# Patient Record
Sex: Female | Born: 1965 | Race: White | Hispanic: No | Marital: Married | Smoking: Never smoker
Health system: Southern US, Community
[De-identification: ages and names within clinical notes are randomized; demographics above are authoritative.]

## PROBLEM LIST (undated history)

## (undated) DIAGNOSIS — Z8719 Personal history of other diseases of the digestive system: Secondary | ICD-10-CM

## (undated) DIAGNOSIS — Z8742 Personal history of other diseases of the female genital tract: Secondary | ICD-10-CM

## (undated) DIAGNOSIS — Z87898 Personal history of other specified conditions: Secondary | ICD-10-CM

## (undated) DIAGNOSIS — K589 Irritable bowel syndrome without diarrhea: Secondary | ICD-10-CM

## (undated) HISTORY — DX: Personal history of other specified conditions: Z87.898

## (undated) HISTORY — PX: REDUCTION MAMMAPLASTY: SUR839

## (undated) HISTORY — DX: Irritable bowel syndrome without diarrhea: K58.9

## (undated) HISTORY — DX: Irritable bowel syndrome, unspecified: K58.9

## (undated) HISTORY — DX: Personal history of other diseases of the digestive system: Z87.19

## (undated) HISTORY — DX: Personal history of other diseases of the female genital tract: Z87.42

---

## 1997-09-29 ENCOUNTER — Other Ambulatory Visit: Admission: RE | Admit: 1997-09-29 | Discharge: 1997-09-29 | Payer: Self-pay | Admitting: Obstetrics & Gynecology

## 1997-12-10 ENCOUNTER — Other Ambulatory Visit: Admission: RE | Admit: 1997-12-10 | Discharge: 1997-12-10 | Payer: Self-pay | Admitting: Obstetrics & Gynecology

## 1998-05-29 HISTORY — PX: OTHER SURGICAL HISTORY: SHX169

## 1998-05-31 ENCOUNTER — Ambulatory Visit (HOSPITAL_COMMUNITY): Admission: RE | Admit: 1998-05-31 | Discharge: 1998-05-31 | Payer: Self-pay | Admitting: *Deleted

## 1998-07-26 ENCOUNTER — Inpatient Hospital Stay (HOSPITAL_COMMUNITY): Admission: AD | Admit: 1998-07-26 | Discharge: 1998-07-26 | Payer: Self-pay | Admitting: Obstetrics & Gynecology

## 1998-08-12 ENCOUNTER — Inpatient Hospital Stay (HOSPITAL_COMMUNITY): Admission: AD | Admit: 1998-08-12 | Discharge: 1998-08-15 | Payer: Self-pay | Admitting: Obstetrics and Gynecology

## 1998-08-18 ENCOUNTER — Encounter: Admission: RE | Admit: 1998-08-18 | Discharge: 1998-09-21 | Payer: Self-pay | Admitting: Obstetrics and Gynecology

## 1998-08-19 ENCOUNTER — Inpatient Hospital Stay (HOSPITAL_COMMUNITY): Admission: AD | Admit: 1998-08-19 | Discharge: 1998-08-19 | Payer: Self-pay | Admitting: Anesthesiology

## 1998-08-20 ENCOUNTER — Ambulatory Visit (HOSPITAL_COMMUNITY): Admission: RE | Admit: 1998-08-20 | Discharge: 1998-08-20 | Payer: Self-pay | Admitting: Anesthesiology

## 1998-08-20 ENCOUNTER — Encounter: Payer: Self-pay | Admitting: Anesthesiology

## 1998-09-08 ENCOUNTER — Encounter (HOSPITAL_COMMUNITY): Admission: RE | Admit: 1998-09-08 | Discharge: 1998-12-07 | Payer: Self-pay | Admitting: *Deleted

## 1998-09-29 ENCOUNTER — Emergency Department (HOSPITAL_COMMUNITY): Admission: EM | Admit: 1998-09-29 | Discharge: 1998-09-29 | Payer: Self-pay | Admitting: *Deleted

## 1998-09-30 ENCOUNTER — Inpatient Hospital Stay (HOSPITAL_COMMUNITY): Admission: EM | Admit: 1998-09-30 | Discharge: 1998-10-03 | Payer: Self-pay | Admitting: Emergency Medicine

## 1998-09-30 ENCOUNTER — Encounter: Payer: Self-pay | Admitting: Neurosurgery

## 1998-10-01 ENCOUNTER — Encounter: Payer: Self-pay | Admitting: Neurosurgery

## 1999-02-18 ENCOUNTER — Other Ambulatory Visit: Admission: RE | Admit: 1999-02-18 | Discharge: 1999-02-18 | Payer: Self-pay | Admitting: Obstetrics & Gynecology

## 2000-01-05 ENCOUNTER — Other Ambulatory Visit: Admission: RE | Admit: 2000-01-05 | Discharge: 2000-01-05 | Payer: Self-pay | Admitting: Obstetrics and Gynecology

## 2000-03-05 ENCOUNTER — Encounter: Payer: Self-pay | Admitting: Obstetrics & Gynecology

## 2000-03-05 ENCOUNTER — Ambulatory Visit (HOSPITAL_COMMUNITY): Admission: RE | Admit: 2000-03-05 | Discharge: 2000-03-05 | Payer: Self-pay | Admitting: Obstetrics & Gynecology

## 2000-07-24 ENCOUNTER — Inpatient Hospital Stay (HOSPITAL_COMMUNITY): Admission: AD | Admit: 2000-07-24 | Discharge: 2000-07-26 | Payer: Self-pay | Admitting: Obstetrics and Gynecology

## 2000-12-11 ENCOUNTER — Other Ambulatory Visit: Admission: RE | Admit: 2000-12-11 | Discharge: 2000-12-11 | Payer: Self-pay | Admitting: Obstetrics and Gynecology

## 2001-09-03 ENCOUNTER — Ambulatory Visit (HOSPITAL_COMMUNITY): Admission: RE | Admit: 2001-09-03 | Discharge: 2001-09-03 | Payer: Self-pay | Admitting: Obstetrics and Gynecology

## 2001-09-03 ENCOUNTER — Encounter: Payer: Self-pay | Admitting: Obstetrics and Gynecology

## 2002-02-04 ENCOUNTER — Inpatient Hospital Stay (HOSPITAL_COMMUNITY): Admission: AD | Admit: 2002-02-04 | Discharge: 2002-02-06 | Payer: Self-pay | Admitting: Obstetrics and Gynecology

## 2002-03-19 ENCOUNTER — Other Ambulatory Visit: Admission: RE | Admit: 2002-03-19 | Discharge: 2002-03-19 | Payer: Self-pay | Admitting: Obstetrics and Gynecology

## 2003-02-27 ENCOUNTER — Encounter: Payer: Self-pay | Admitting: Family Medicine

## 2003-02-27 LAB — CONVERTED CEMR LAB

## 2003-05-25 ENCOUNTER — Other Ambulatory Visit: Admission: RE | Admit: 2003-05-25 | Discharge: 2003-05-25 | Payer: Self-pay | Admitting: Obstetrics and Gynecology

## 2004-04-14 ENCOUNTER — Encounter: Admission: RE | Admit: 2004-04-14 | Discharge: 2004-04-14 | Payer: Self-pay | Admitting: Family Medicine

## 2004-07-12 ENCOUNTER — Other Ambulatory Visit: Admission: RE | Admit: 2004-07-12 | Discharge: 2004-07-12 | Payer: Self-pay | Admitting: Obstetrics and Gynecology

## 2005-06-05 ENCOUNTER — Encounter: Admission: RE | Admit: 2005-06-05 | Discharge: 2005-06-05 | Payer: Self-pay | Admitting: Family Medicine

## 2006-06-06 ENCOUNTER — Encounter: Admission: RE | Admit: 2006-06-06 | Discharge: 2006-06-06 | Payer: Self-pay | Admitting: Family Medicine

## 2006-12-24 ENCOUNTER — Encounter: Payer: Self-pay | Admitting: Family Medicine

## 2006-12-24 DIAGNOSIS — E7800 Pure hypercholesterolemia, unspecified: Secondary | ICD-10-CM | POA: Insufficient documentation

## 2006-12-27 ENCOUNTER — Ambulatory Visit: Payer: Self-pay | Admitting: Family Medicine

## 2006-12-27 LAB — CONVERTED CEMR LAB
Albumin: 4.2 g/dL (ref 3.5–5.2)
Basophils Relative: 0.2 % (ref 0.0–1.0)
Bilirubin, Direct: 0.1 mg/dL (ref 0.0–0.3)
Creatinine, Ser: 1 mg/dL (ref 0.4–1.2)
Eosinophils Relative: 1.3 % (ref 0.0–5.0)
GFR calc non Af Amer: 65 mL/min
Glucose, Bld: 90 mg/dL (ref 70–99)
HCT: 41.9 % (ref 36.0–46.0)
Hemoglobin: 14.4 g/dL (ref 12.0–15.0)
MCHC: 34.4 g/dL (ref 30.0–36.0)
Monocytes Absolute: 0.4 10*3/uL (ref 0.2–0.7)
Neutrophils Relative %: 44.1 % (ref 43.0–77.0)
Potassium: 4.2 meq/L (ref 3.5–5.1)
RBC: 4.53 M/uL (ref 3.87–5.11)
RDW: 11.9 % (ref 11.5–14.6)
Sodium: 140 meq/L (ref 135–145)
TSH: 1.65 microintl units/mL (ref 0.35–5.50)
Total Bilirubin: 0.9 mg/dL (ref 0.3–1.2)
WBC: 3.4 10*3/uL — ABNORMAL LOW (ref 4.5–10.5)

## 2007-01-02 ENCOUNTER — Ambulatory Visit: Payer: Self-pay | Admitting: Family Medicine

## 2007-01-02 DIAGNOSIS — M25559 Pain in unspecified hip: Secondary | ICD-10-CM | POA: Insufficient documentation

## 2007-01-02 DIAGNOSIS — M25519 Pain in unspecified shoulder: Secondary | ICD-10-CM | POA: Insufficient documentation

## 2007-09-30 ENCOUNTER — Encounter: Admission: RE | Admit: 2007-09-30 | Discharge: 2007-09-30 | Payer: Self-pay | Admitting: Obstetrics and Gynecology

## 2007-10-03 ENCOUNTER — Encounter: Admission: RE | Admit: 2007-10-03 | Discharge: 2007-10-03 | Payer: Self-pay | Admitting: Obstetrics and Gynecology

## 2008-04-09 ENCOUNTER — Encounter: Admission: RE | Admit: 2008-04-09 | Discharge: 2008-04-09 | Payer: Self-pay | Admitting: Family Medicine

## 2009-01-13 ENCOUNTER — Encounter: Admission: RE | Admit: 2009-01-13 | Discharge: 2009-01-13 | Payer: Self-pay | Admitting: Internal Medicine

## 2009-04-29 DIAGNOSIS — Z87898 Personal history of other specified conditions: Secondary | ICD-10-CM

## 2009-04-29 HISTORY — DX: Personal history of other specified conditions: Z87.898

## 2009-06-24 ENCOUNTER — Encounter: Admission: RE | Admit: 2009-06-24 | Discharge: 2009-06-24 | Payer: Self-pay | Admitting: Internal Medicine

## 2009-07-28 ENCOUNTER — Encounter (INDEPENDENT_AMBULATORY_CARE_PROVIDER_SITE_OTHER): Payer: Self-pay | Admitting: Obstetrics and Gynecology

## 2009-07-28 ENCOUNTER — Ambulatory Visit (HOSPITAL_COMMUNITY): Admission: RE | Admit: 2009-07-28 | Discharge: 2009-07-29 | Payer: Self-pay | Admitting: Obstetrics and Gynecology

## 2009-07-28 HISTORY — PX: DILATION AND CURETTAGE OF UTERUS: SHX78

## 2009-07-28 HISTORY — PX: ENDOMETRIAL ABLATION W/ NOVASURE: SUR434

## 2009-07-28 HISTORY — PX: CYSTOSCOPY: SUR368

## 2009-07-28 HISTORY — PX: HYSTEROSCOPY: SHX211

## 2009-12-30 ENCOUNTER — Encounter (INDEPENDENT_AMBULATORY_CARE_PROVIDER_SITE_OTHER): Payer: Self-pay | Admitting: *Deleted

## 2010-05-31 ENCOUNTER — Encounter
Admission: RE | Admit: 2010-05-31 | Discharge: 2010-05-31 | Payer: Self-pay | Source: Home / Self Care | Attending: Obstetrics and Gynecology | Admitting: Obstetrics and Gynecology

## 2010-06-19 ENCOUNTER — Encounter: Payer: Self-pay | Admitting: Obstetrics and Gynecology

## 2010-06-30 NOTE — Letter (Signed)
Summary: Nadara Eaton letter  [place] at [facility]  [address] [place], [place] [address]   Phone: [phone]  Fax: [phone]       12/30/2009 MRN: [id number]  HA SHANNAHAN [address] [place], [place]  [address]  Dear Ms. [facility] [place] and [place] announce the retirement of Arta Silence, M.D., from full-time practice at the [facility] office effective November 25, 2009 and his plans of returning part-time.  It is important to Dr. Hetty Ely and to our practice that you understand that [facility] has seven physicians in our office for your health care needs.  We will continue to offer the same exceptional care that you have today.    Dr. Hetty Ely has spoken to many of you about his plans for retirement and returning part-time in the fall.   We will continue to work with you through the transition to schedule appointments for you in the office and meet the high standards that [facility] is committed to.   Again, it is with great pleasure that we share the news that Dr. Hetty Ely will return to [facility] at [facility] in October of 2011 with a reduced schedule.    If you have any questions, or would like to request an appointment with one of our physicians, please call us at [phone] and press the option for Scheduling an appointment.  We take pleasure in providing you with excellent patient care and look forward to seeing you at your next office visit.  Our [facility] Physicians are:  Tillman Abide, M.D. Laurita Quint, M.D. Roxy Manns, M.D. Kerby Nora, M.D. Hannah Beat, M.D. Ruthe Mannan, M.D. We proudly welcomed Raechel Ache, M.D. and Eustaquio Boyden, M.D. to the practice in July/August 2011.  Sincerely,  [place]

## 2010-08-19 LAB — CBC
HCT: 37.1 % (ref 36.0–46.0)
Hemoglobin: 15 g/dL (ref 12.0–15.0)
MCHC: 33.2 g/dL (ref 30.0–36.0)
Platelets: 194 10*3/uL (ref 150–400)
Platelets: 209 10*3/uL (ref 150–400)
RBC: 3.94 MIL/uL (ref 3.87–5.11)
RDW: 13 % (ref 11.5–15.5)
WBC: 14.8 10*3/uL — ABNORMAL HIGH (ref 4.0–10.5)

## 2010-10-14 NOTE — H&P (Signed)
NAME:  CATELIN, MANTHE                      ACCOUNT NO.:  [id number]   MEDICAL RECORD NO.:  [id number]                   PATIENT TYPE:  MAT   LOCATION:  [facility]                                 FACILITY:  [facility]   PHYSICIAN:  Hal Morales, M.D.             DATE OF BIRTH:  09/25/1965   DATE OF ADMISSION:  02/04/2002  DATE OF DISCHARGE:                                HISTORY & PHYSICAL   HISTORY OF PRESENT ILLNESS:  The patient is a 45 year old, married, white  female, gravida 4, para 2-0-1-2, at [redacted] weeks gestation today, who presents  complaining of uterine contractions that were strong and regular last night,  but now less regular and less strong, but still having bloody show and lots  of pressure.  She reports positive fetal movement.  She denies headache or  visual disturbances.  She also denies any nausea or vomiting.  Her pregnancy  has been followed at [facility] by the certified nurse midwife  service and has been essentially uncomplicated, though at risk for:  1.  History of PIH with her first pregnancy.  2.  History of advanced maternal  age with no amniocentesis with this pregnancy.  3. History of rapid labor.  4.  History of back surgery.  Her group B Streptococcus  is negative.   OBSTETRICAL/GYNECOLOGICAL HISTORY:  She is a gravida 4, para 2-0-1-2, who  had a miscarriage in 1988 with no complications.  In March of 2000, she  delivered a viable female infant who weighed 6 pounds 7 ounces at [redacted] weeks  gestation following a 12-hour labor.  She did have an epidural with that  labor and was induced for post dates.  She had back problems following that  delivery and actually had surgery approximately seven weeks postpartum.  In  February of 2002, she delivered another viable female infant who weighed 7  pounds 4 ounces at 39-5/7 weeks following a three-hour labor also with no  complications, attended by Concha Pyo. Duplantis, C.N.M.   ALLERGIES:  She has no known drug  allergies.   PAST MEDICAL HISTORY:  She reports having had the usual childhood diseases.  History of shingles in June of 2000.  No other health problems.   PAST SURGICAL HISTORY:  Her only surgical history was repair of herniated  disk in May of 2000 and wisdom tooth removal at age 25.   FAMILY HISTORY:  Significant for mother and a sister with hypertension,  maternal grandmother with heart disease, maternal grandfather with stroke.  Mother with type 3 diabetes.  Maternal grandmother with unknown kind of  cancer.   GENETIC HISTORY:  Negative, except that she is over age 27 and declined an  amniocentesis.   SOCIAL HISTORY:  She is married to [employer], who is involved and  supportive.  She is full-time employed as an Acupuncturist.  He is  employed full-time in [employer].  They are of  the [place] faith.  They deny  any illicit drug use, alcohol, or smoking with this pregnancy.   PRENATAL LABORATORY DATA:  Her blood type is O+.  Her antibody screen is  negative.  Syphilis is nonreactive.  Rubella is immune.  Hepatitis B surface  antigen is negative.  HIV was declined.  GC and chlamydia were declined.  Her Pap was within normal limits.  Her one-hour Glucola was within normal  range.  Her maternal serum alpha fetoprotein was also within normal range.  Her 36-week beta Staphylococcus was negative.   PHYSICAL EXAMINATION:  VITAL SIGNS:  Stable.  She is afebrile.  HEENT:  Grossly within normal limits.  HEART:  Regular rate and rhythm.  CHEST:  Clear.  BREASTS:  Soft and nontender.  ABDOMEN:  Gravid with irregular mild uterine contractions.  The fetal heart  rate is reactive and reassuring.  PELVIC:  Her cervix is approximately 4 cm, 80%, and vertex -1 to 0.  Bulging  membranes.  Positive bloody show.  EXTREMITIES:  Within normal limits.   ASSESSMENT:  1. Intrauterine pregnancy at term.  2. Early labor with advanced cervical changes.  3. Negative group B  Streptococcus.  4. History of rapid labor.   PLAN:  Admit to labor and delivery per consultation with Hal Morales,  M.D.  We will plan to follow routine C.N.M. orders to artificially rupture  of membranes and the patient is in agreement with the plan.     Concha Pyo. Duplantis, C.N.M.              Hal Morales, M.D.    SJD/MEDQ  D:  02/04/2002  T:  02/04/2002  Job:  [id number]
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
## 2011-05-30 HISTORY — PX: APPENDECTOMY: SHX54

## 2011-06-06 ENCOUNTER — Other Ambulatory Visit: Payer: Self-pay | Admitting: Obstetrics and Gynecology

## 2011-06-06 DIAGNOSIS — Z1231 Encounter for screening mammogram for malignant neoplasm of breast: Secondary | ICD-10-CM

## 2011-06-23 ENCOUNTER — Ambulatory Visit
Admission: RE | Admit: 2011-06-23 | Discharge: 2011-06-23 | Disposition: A | Discharge status: Home / Self Care | Payer: BC Managed Care – PPO | Source: Ambulatory Visit | Attending: Obstetrics and Gynecology | Admitting: Obstetrics and Gynecology

## 2011-06-23 DIAGNOSIS — Z1231 Encounter for screening mammogram for malignant neoplasm of breast: Secondary | ICD-10-CM

## 2011-06-28 ENCOUNTER — Other Ambulatory Visit: Payer: Self-pay | Admitting: Obstetrics and Gynecology

## 2011-06-28 DIAGNOSIS — R928 Other abnormal and inconclusive findings on diagnostic imaging of breast: Secondary | ICD-10-CM

## 2011-07-14 ENCOUNTER — Ambulatory Visit
Admission: RE | Admit: 2011-07-14 | Discharge: 2011-07-14 | Disposition: A | Discharge status: Home / Self Care | Payer: BC Managed Care – PPO | Source: Ambulatory Visit | Attending: Obstetrics and Gynecology | Admitting: Obstetrics and Gynecology

## 2011-07-14 DIAGNOSIS — R928 Other abnormal and inconclusive findings on diagnostic imaging of breast: Secondary | ICD-10-CM

## 2011-09-22 ENCOUNTER — Ambulatory Visit (INDEPENDENT_AMBULATORY_CARE_PROVIDER_SITE_OTHER): Payer: BC Managed Care – PPO | Admitting: Obstetrics and Gynecology

## 2011-09-22 ENCOUNTER — Encounter: Payer: Self-pay | Admitting: Obstetrics and Gynecology

## 2011-09-22 VITALS — BP 124/88 | Temp 98.3°F | Resp 16 | Ht 66.0 in | Wt 182.0 lb

## 2011-09-22 DIAGNOSIS — N63 Unspecified lump in unspecified breast: Secondary | ICD-10-CM

## 2011-09-22 NOTE — Progress Notes (Signed)
Reason for consultation: Right breast mass  History of present illness: This is a 46 year old female who presents with complaints of finding a left breast mass one month ago.  There is no pain or leaking.  There is no family history of breast cancer.  She is a waiting a breast reduction surgery in July with Dr. Odis Luster.  Breast exam: Left breast normal with negative nodes.  0.5 cm solid nodule behind right nipple, nontender and mobile.  Negative nodes.  Assessment: Right breast mass  Plan: Patient is referred for diagnostic mammogram at the breast center.

## 2011-09-29 ENCOUNTER — Ambulatory Visit
Admission: RE | Admit: 2011-09-29 | Discharge: 2011-09-29 | Disposition: A | Discharge status: Home / Self Care | Payer: BC Managed Care – PPO | Source: Ambulatory Visit | Attending: Obstetrics and Gynecology | Admitting: Obstetrics and Gynecology

## 2011-09-29 ENCOUNTER — Other Ambulatory Visit: Payer: Self-pay | Admitting: Obstetrics and Gynecology

## 2011-09-29 DIAGNOSIS — N63 Unspecified lump in unspecified breast: Secondary | ICD-10-CM

## 2011-11-10 ENCOUNTER — Encounter: Payer: Self-pay | Admitting: Family Medicine

## 2011-11-10 ENCOUNTER — Ambulatory Visit (INDEPENDENT_AMBULATORY_CARE_PROVIDER_SITE_OTHER): Payer: BC Managed Care – PPO | Admitting: Family Medicine

## 2011-11-10 VITALS — BP 160/102 | HR 80 | Temp 98.2°F | Wt 181.0 lb

## 2011-11-10 DIAGNOSIS — E785 Hyperlipidemia, unspecified: Secondary | ICD-10-CM

## 2011-11-10 DIAGNOSIS — I10 Essential (primary) hypertension: Secondary | ICD-10-CM | POA: Insufficient documentation

## 2011-11-10 DIAGNOSIS — R030 Elevated blood-pressure reading, without diagnosis of hypertension: Secondary | ICD-10-CM | POA: Insufficient documentation

## 2011-11-10 DIAGNOSIS — E7800 Pure hypercholesterolemia, unspecified: Secondary | ICD-10-CM

## 2011-11-10 MED ORDER — LISINOPRIL 20 MG PO TABS: 20.0000 mg | ORAL_TABLET | Freq: Every day | ORAL | Status: DC

## 2011-11-10 NOTE — Progress Notes (Signed)
  Subjective:    Patient ID: Abigail Stout, female    DOB: 1965/07/03, 46 y.o.   MRN: [id number]  HPI 46 yo pt new to me for acute visit for "nneds to check her BP."  Of note, she is no longer established here.  Has been seeing Tomi Bamberger, NP but she has closed her practice.  HTN- has been on and off antihypertensives for years. Under more stress lately, traveling. Not exercising or eating the way she should.  Has been having some HA.  Feels BP is up. No CP or SOB. Pos FH of HTN.  HLD- per pt does have elevated lipids. Not taking anything for it.  Patient Active Problem List  Diagnosis  . HYPERCHOLESTEROLEMIA  . SHOULDER PAIN, LEFT, CHRONIC  . HIP PAIN, LEFT  . Elevated blood pressure reading without diagnosis of hypertension  . HTN (hypertension)   Past Medical History  Diagnosis Date  . H/O urinary incontinence   . H/O menorrhagia   . Irritable bowel syndrome (IBS)   . H/O hemorrhoids   . History of urinary retention 04/29/09   Past Surgical History  Procedure Date  . Hysteroscopy 07/28/09  . Dilation and curettage of uterus 07/28/09  . Endometrial ablation w/ novasure 07/28/09  . Cystoscopy 07/28/09  . Lumbar diskectomy 2000   History  Substance Use Topics  . Smoking status: Never Smoker   . Smokeless tobacco: Not on file  . Alcohol Use: No   Family History  Problem Relation Age of Onset  . Diabetes Mother   . Hypertension Mother   . Stroke Paternal Grandfather    No Known Allergies Current Outpatient Prescriptions on File Prior to Visit  Medication Sig Dispense Refill  . lisinopril (PRINIVIL,ZESTRIL) 20 MG tablet Take 1 tablet (20 mg total) by mouth daily.  90 tablet  3   The PMH, PSH, Social History, Family History, Medications, and allergies have been reviewed in [facility], and have been updated if relevant.  Review of Systems See HPI    Objective:   Physical Exam BP 160/102  Pulse 80  Temp 98.2 F (36.8 C) (Oral)  Wt 181 lb (82.101 kg)  General:   Well-developed,well-nourished,in no acute distress; alert,appropriate and cooperative throughout examination Head:  normocephalic and atraumatic.   Eyes:  vision grossly intact, pupils equal, pupils round, and pupils reactive to light.   Ears:  R ear normal and L ear normal.   Nose:  no external deformity.   Mouth:  good dentition.   Lungs:  Normal respiratory effort, chest expands symmetrically. Lungs are clear to auscultation, no crackles or wheezes. Heart:  Normal rate and regular rhythm. S1 and S2 normal without gallop, murmur, click, rub or other extra sounds. Abdomen:  Bowel sounds positive,abdomen soft and non-tender without masses, organomegaly or hernias noted. Msk:  No deformity or scoliosis noted of thoracic or lumbar spine.   Extremities:  No clubbing, cyanosis, edema, or deformity noted with normal full range of motion of all joints.   Neurologic:  alert & oriented X3 and gait normal.   Skin:  Intact without suspicious lesions or rashes Psych:  Cognition and judgment appear intact. Alert and cooperative with normal attention span and concentration. No apparent delusions, illusions, hallucinations    Assessment & Plan:   1. HTN (hypertension)  Deteriorated. Start lisinopril 20 mg daily. Check CMET Follow up in 2 weeks. Comprehensive metabolic panel  2. HLD (hyperlipidemia)  Lipid Panel

## 2011-11-10 NOTE — Patient Instructions (Addendum)
We are starting lisinopril today. Please make appt to see me in 2 weeks.  Cut back on salt intake. Increase physical activity.

## 2011-11-11 LAB — COMPREHENSIVE METABOLIC PANEL
ALT: 13 U/L (ref 0–35)
Albumin: 3.9 g/dL (ref 3.5–5.2)
CO2: 28 mEq/L (ref 19–32)
Calcium: 9.4 mg/dL (ref 8.4–10.5)
Chloride: 107 mEq/L (ref 96–112)
Creat: 0.98 mg/dL (ref 0.50–1.10)
Potassium: 4.7 mEq/L (ref 3.5–5.3)
Total Protein: 6.4 g/dL (ref 6.0–8.3)

## 2011-11-11 LAB — LIPID PANEL
Cholesterol: 203 mg/dL — ABNORMAL HIGH (ref 0–200)
LDL Cholesterol: 122 mg/dL — ABNORMAL HIGH (ref 0–99)
Total CHOL/HDL Ratio: 3.3 Ratio
Triglycerides: 99 mg/dL (ref <150)
VLDL: 20 mg/dL (ref 0–40)

## 2011-11-17 ENCOUNTER — Encounter: Payer: Self-pay | Admitting: *Deleted

## 2011-11-28 ENCOUNTER — Other Ambulatory Visit: Payer: Self-pay | Admitting: Plastic Surgery

## 2011-11-29 HISTORY — PX: BREAST REDUCTION SURGERY: SHX8

## 2012-02-09 ENCOUNTER — Inpatient Hospital Stay: Payer: Self-pay | Admitting: Surgery

## 2012-02-09 LAB — URINALYSIS, COMPLETE
Bilirubin,UR: NEGATIVE
Leukocyte Esterase: NEGATIVE
Ph: 5 (ref 4.5–8.0)
Protein: NEGATIVE
RBC,UR: NONE SEEN /HPF (ref 0–5)
Squamous Epithelial: 1
WBC UR: <1 /HPF (ref 0–5)

## 2012-02-09 LAB — CBC
HCT: 44.2 % (ref 35.0–47.0)
HGB: 14.9 g/dL (ref 12.0–16.0)
MCH: 30.5 pg (ref 26.0–34.0)
MCHC: 33.7 g/dL (ref 32.0–36.0)

## 2012-02-09 LAB — COMPREHENSIVE METABOLIC PANEL
Albumin: 4 g/dL (ref 3.4–5.0)
Alkaline Phosphatase: 76 U/L (ref 50–136)
Bilirubin,Total: 0.4 mg/dL (ref 0.2–1.0)
Creatinine: 1.02 mg/dL (ref 0.60–1.30)
Glucose: 105 mg/dL — ABNORMAL HIGH (ref 65–99)
Osmolality: 281 (ref 275–301)
SGPT (ALT): 21 U/L (ref 12–78)
Sodium: 139 mmol/L (ref 136–145)

## 2012-02-09 LAB — PREGNANCY, URINE: Pregnancy Test, Urine: NEGATIVE m[IU]/mL

## 2012-02-13 LAB — PATHOLOGY REPORT

## 2012-04-23 ENCOUNTER — Encounter: Payer: BC Managed Care – PPO | Admitting: Sports Medicine

## 2012-05-09 ENCOUNTER — Ambulatory Visit (INDEPENDENT_AMBULATORY_CARE_PROVIDER_SITE_OTHER): Payer: BC Managed Care – PPO | Admitting: Sports Medicine

## 2012-05-09 VITALS — BP 148/81 | Ht 66.0 in | Wt 175.0 lb

## 2012-05-09 DIAGNOSIS — R269 Unspecified abnormalities of gait and mobility: Secondary | ICD-10-CM | POA: Insufficient documentation

## 2012-05-09 DIAGNOSIS — Q667 Congenital pes cavus, unspecified foot: Secondary | ICD-10-CM

## 2012-05-09 DIAGNOSIS — M216X9 Other acquired deformities of unspecified foot: Secondary | ICD-10-CM

## 2012-05-09 NOTE — Assessment & Plan Note (Signed)
When not wearing orthotics she should continue to shop for shoes that have excellent arch support  She already knows that she feels better with the support  She can also use the arch strap she is found as it seems to help as well

## 2012-05-09 NOTE — Progress Notes (Signed)
Abigail Stout is a 46 y.o. female who presents to [facility] today for foot pain. Present for years. She was diagnosed with high arch and ankle subluxation in the past.  She had orthotics years ago which helped however she no longer has any. Additionally she notes some knee and back pain when not wearing her arch supports.  She notes the over-the-counter arch supports are helpful but not fully, and is interested in orthotics today.  She feels well otherwise without any radiating pain weakness or numbness.   PMH reviewed. Hypertension History  Substance Use Topics  . Smoking status: Never Smoker   . Smokeless tobacco: Not on file  . Alcohol Use: No   ROS as above otherwise neg   Exam:  BP 148/81  Ht 5\' 6"  (1.676 m)  Wt 175 lb (79.379 kg)  BMI 28.25 kg/m2 Gen: Well NAD MSK: Feet bilaterally.  Significant cavus foot, which collapse more than 50% with standing.  Bunionette bilaterally with developing hallux rigidus bilaterally.  Preserved posterior tibialis function with toe standing.   Leg length are equal Hip abduction strength is 5/5 bilaterally  Gait: Significant ankle pronation with mild subluxation bilaterally with external rotation of the feet bilaterally.     Patient was fitted for a : standard, cushioned, semi-rigid orthotic. The orthotic was heated and afterward the patient stood on the orthotic blank positioned on the orthotic stand. The patient was positioned in subtalar neutral position and 10 degrees of ankle dorsiflexion in a weight bearing stance. After completion of molding, a stable base was applied to the orthotic blank. The blank was ground to a stable position for weight bearing. Size: 9 red EVA Base: Blue EVA Posting and Padding: none

## 2012-05-09 NOTE — Patient Instructions (Addendum)
Thank you for coming in today. Let's see how these orthotics do,  If they are not right please come back Please see your primary care Dr. about your blood pressure

## 2012-05-09 NOTE — Assessment & Plan Note (Addendum)
Associated with a cavus foot. She is developing some breakdown of long arch and transverse arch RT > LT Plan: Custom orthotics today Followup as needed

## 2012-06-25 ENCOUNTER — Other Ambulatory Visit: Payer: Self-pay | Admitting: Obstetrics and Gynecology

## 2012-06-25 DIAGNOSIS — Z1231 Encounter for screening mammogram for malignant neoplasm of breast: Secondary | ICD-10-CM

## 2012-07-03 ENCOUNTER — Ambulatory Visit
Admission: RE | Admit: 2012-07-03 | Discharge: 2012-07-03 | Disposition: A | Discharge status: Home / Self Care | Payer: BC Managed Care – PPO | Source: Ambulatory Visit | Attending: Obstetrics and Gynecology | Admitting: Obstetrics and Gynecology

## 2012-07-03 DIAGNOSIS — Z1231 Encounter for screening mammogram for malignant neoplasm of breast: Secondary | ICD-10-CM

## 2012-07-18 ENCOUNTER — Encounter: Payer: Self-pay | Admitting: Obstetrics and Gynecology

## 2012-07-18 ENCOUNTER — Ambulatory Visit: Payer: BC Managed Care – PPO | Admitting: Obstetrics and Gynecology

## 2012-07-18 VITALS — BP 104/70 | Ht 66.0 in | Wt 185.0 lb

## 2012-07-18 DIAGNOSIS — N3281 Overactive bladder: Secondary | ICD-10-CM

## 2012-07-18 DIAGNOSIS — N898 Other specified noninflammatory disorders of vagina: Secondary | ICD-10-CM

## 2012-07-18 DIAGNOSIS — R102 Pelvic and perineal pain: Secondary | ICD-10-CM

## 2012-07-18 DIAGNOSIS — IMO0002 Reserved for concepts with insufficient information to code with codable children: Secondary | ICD-10-CM

## 2012-07-18 LAB — POCT WET PREP (WET MOUNT): Clue Cells Wet Prep Whiff POC: NEGATIVE

## 2012-07-18 LAB — POCT URINALYSIS DIPSTICK
Ketones, UA: NEGATIVE
Protein, UA: NEGATIVE
Spec Grav, UA: 1.015
pH, UA: 6

## 2012-07-18 NOTE — Progress Notes (Signed)
Here secondary to pressure down there.  Still has urgency but s/p sling so denies SUI.    Filed Vitals:   07/18/12 1451  BP: 104/70   ROS: noncontributory  Pelvic exam:  VULVA: normal appearing vulva with no masses, tenderness or lesions,  VAGINA: normal appearing vagina with normal color and discharge, no lesions, grade 2 cystocele CERVIX: normal appearing cervix without discharge or lesions,  UTERUS: uterus is normal size, shape, consistency and nontender,  ADNEXA: normal adnexa in size, nontender and no masses. RECTAL no masses, grade 1 rectocele  A/P 6wks to f/u vesicare If sxs persist and bother pt enough, she may consider Ant poss post repair.  Will get u/s at that time as well. UCx AEX in 6wks too

## 2012-07-20 LAB — URINE CULTURE: Colony Count: NO GROWTH

## 2012-09-27 ENCOUNTER — Ambulatory Visit (INDEPENDENT_AMBULATORY_CARE_PROVIDER_SITE_OTHER): Payer: BC Managed Care – PPO | Admitting: Family Medicine

## 2012-09-27 ENCOUNTER — Encounter: Payer: Self-pay | Admitting: Family Medicine

## 2012-09-27 VITALS — BP 120/72 | HR 103 | Temp 98.2°F | Ht 66.0 in | Wt 186.5 lb

## 2012-09-27 DIAGNOSIS — J309 Allergic rhinitis, unspecified: Secondary | ICD-10-CM

## 2012-09-27 DIAGNOSIS — J209 Acute bronchitis, unspecified: Secondary | ICD-10-CM

## 2012-09-27 MED ORDER — GUAIFENESIN-CODEINE 100-10 MG/5ML PO SYRP: 5.0000 mL | ORAL_SOLUTION | Freq: Every evening | ORAL | Status: DC | PRN

## 2012-09-27 MED ORDER — AZITHROMYCIN 250 MG PO TABS: ORAL_TABLET | ORAL | Status: DC

## 2012-09-27 NOTE — Progress Notes (Signed)
  Subjective:    Patient ID: Abigail Stout, female    DOB: 30-Jan-1966, 47 y.o.   MRN: [id number]  Otalgia  There is pain in the left ear. The current episode started in the past 7 days (Noted symptoms start after blwing pollen /leaves outside with blower.). The problem occurs constantly. There has been no fever. Associated symptoms include coughing and a sore throat. Pertinent negatives include no headaches. Associated symptoms comments: No ear drainage..  Cough This is a new problem. The current episode started in the past 7 days. The problem has been gradually worsening. The problem occurs constantly. The cough is non-productive (keeping her up aty night). Associated symptoms include ear pain, nasal congestion and a sore throat. Pertinent negatives include no chills, fever, headaches, shortness of breath or wheezing. The symptoms are aggravated by pollens. Risk factors: non smoker. She has tried OTC cough suppressant (robitussin DM, guafenesin) for the symptoms. There is no history of asthma, bronchiectasis, bronchitis, COPD, emphysema, environmental allergies or pneumonia.      Review of Systems  Constitutional: Negative for fever and chills.  HENT: Positive for ear pain and sore throat.   Respiratory: Positive for cough. Negative for shortness of breath and wheezing.   Allergic/Immunologic: Negative for environmental allergies.  Neurological: Negative for headaches.       Objective:   Physical Exam  Constitutional: She is oriented to person, place, and time. Vital signs are normal. She appears well-developed and well-nourished. She is cooperative.  Non-toxic appearance. She does not appear ill. No distress.  HENT:  Head: Normocephalic.  Right Ear: Hearing, tympanic membrane, external ear and ear canal normal. Tympanic membrane is not erythematous, not retracted and not bulging.  Left Ear: Hearing, tympanic membrane, external ear and ear canal normal. Tympanic membrane is not  erythematous, not retracted and not bulging.  Nose: Mucosal edema and rhinorrhea present. Right sinus exhibits no maxillary sinus tenderness and no frontal sinus tenderness. Left sinus exhibits no maxillary sinus tenderness and no frontal sinus tenderness.  Mouth/Throat: Uvula is midline and mucous membranes are normal. Posterior oropharyngeal erythema present.  Eyes: Conjunctivae, EOM and lids are normal. Pupils are equal, round, and reactive to light. No foreign bodies found.  Neck: Trachea normal and normal range of motion. Neck supple. Carotid bruit is not present. No mass and no thyromegaly present.  Cardiovascular: Normal rate, regular rhythm, S1 normal, S2 normal, normal heart sounds, intact distal pulses and normal pulses.  Exam reveals no gallop and no friction rub.   No murmur heard. Pulmonary/Chest: Effort normal and breath sounds normal. Not tachypneic. No respiratory distress. She has no decreased breath sounds. She has no wheezes. She has no rhonchi. She has no rales.  Constant cough  Neurological: She is alert and oriented to person, place, and time.  Skin: Skin is warm, dry and intact. No rash noted.  Psychiatric: Her speech is normal and behavior is normal. Judgment normal. Her mood appears not anxious. Cognition and memory are normal. She does not exhibit a depressed mood.          Assessment & Plan:

## 2012-09-27 NOTE — Patient Instructions (Addendum)
Guaifenesin during the day, cough Supressant at night. Also zyrtec at bedtime for allergy component. Complete course of antibiotics. If cough is persistent .Marland Kitchen Call you may need albuterol inhaler for reactive airways cough.

## 2012-09-27 NOTE — Assessment & Plan Note (Signed)
Symptoms ongoing x 1 week... Cover with antibiotics.  Treat for possible allergic component. Cough suppressant at night.

## 2012-12-18 ENCOUNTER — Other Ambulatory Visit: Payer: Self-pay | Admitting: Family Medicine

## 2013-04-03 ENCOUNTER — Other Ambulatory Visit: Payer: Self-pay

## 2013-04-21 ENCOUNTER — Other Ambulatory Visit: Payer: Self-pay | Admitting: Family Medicine

## 2013-04-21 NOTE — Telephone Encounter (Signed)
Last office visit 09/27/2012.  Ok to refill?

## 2013-07-04 ENCOUNTER — Other Ambulatory Visit: Payer: Self-pay

## 2013-07-04 DIAGNOSIS — Z9889 Other specified postprocedural states: Secondary | ICD-10-CM

## 2013-07-04 DIAGNOSIS — Z1231 Encounter for screening mammogram for malignant neoplasm of breast: Secondary | ICD-10-CM

## 2013-07-21 ENCOUNTER — Ambulatory Visit
Admission: RE | Admit: 2013-07-21 | Discharge: 2013-07-21 | Disposition: A | Discharge status: Home / Self Care | Payer: BC Managed Care – PPO | Source: Ambulatory Visit

## 2013-07-21 DIAGNOSIS — Z9889 Other specified postprocedural states: Secondary | ICD-10-CM

## 2013-07-21 DIAGNOSIS — Z1231 Encounter for screening mammogram for malignant neoplasm of breast: Secondary | ICD-10-CM

## 2013-08-25 ENCOUNTER — Other Ambulatory Visit: Payer: Self-pay | Admitting: Family Medicine

## 2013-08-25 NOTE — Telephone Encounter (Signed)
Last office visit 09/27/2012 for Bronchitis.  Ok to refill?

## 2013-08-25 NOTE — Telephone Encounter (Signed)
Nees app for [employer], labs priort.. Refill until then.

## 2013-09-04 ENCOUNTER — Telehealth: Payer: Self-pay | Admitting: Family Medicine

## 2013-09-04 ENCOUNTER — Other Ambulatory Visit (INDEPENDENT_AMBULATORY_CARE_PROVIDER_SITE_OTHER): Payer: BC Managed Care – PPO

## 2013-09-04 DIAGNOSIS — E7800 Pure hypercholesterolemia, unspecified: Secondary | ICD-10-CM

## 2013-09-04 DIAGNOSIS — I10 Essential (primary) hypertension: Secondary | ICD-10-CM

## 2013-09-04 LAB — COMPREHENSIVE METABOLIC PANEL
ALK PHOS: 48 U/L (ref 39–117)
ALT: 16 U/L (ref 0–35)
AST: 18 U/L (ref 0–37)
Albumin: 3.7 g/dL (ref 3.5–5.2)
BILIRUBIN TOTAL: 0.6 mg/dL (ref 0.3–1.2)
BUN: 13 mg/dL (ref 6–23)
CO2: 29 mEq/L (ref 19–32)
CREATININE: 0.9 mg/dL (ref 0.4–1.2)
Calcium: 9.3 mg/dL (ref 8.4–10.5)
Chloride: 103 mEq/L (ref 96–112)
GFR: 68.1 mL/min (ref >60.00)
GLUCOSE: 83 mg/dL (ref 70–99)
Potassium: 4.2 mEq/L (ref 3.5–5.1)
SODIUM: 137 meq/L (ref 135–145)
TOTAL PROTEIN: 6.2 g/dL (ref 6.0–8.3)

## 2013-09-04 LAB — LIPID PANEL
CHOLESTEROL: 201 mg/dL — AB (ref 0–200)
HDL: 66.3 mg/dL (ref >39.00)
LDL CALC: 122 mg/dL — AB (ref 0–99)
Total CHOL/HDL Ratio: 3
Triglycerides: 62 mg/dL (ref 0.0–149.0)
VLDL: 12.4 mg/dL (ref 0.0–40.0)

## 2013-09-04 NOTE — Telephone Encounter (Signed)
Relevant patient education assigned to patient using Emmi. ° °

## 2013-09-04 NOTE — Telephone Encounter (Signed)
Message copied by Eliezer Lofts E on Thu Sep 04, 2013  1:02 PM ------      Message from: Ellamae Sia      Created: Thu Sep 04, 2013  9:33 AM      Regarding: Lab orders asap       Patient is scheduled for CPX labs, please order future labs, Thanks , Terri       ------

## 2013-09-09 ENCOUNTER — Encounter: Payer: Self-pay | Admitting: Family Medicine

## 2013-09-09 ENCOUNTER — Other Ambulatory Visit (HOSPITAL_COMMUNITY)
Admission: RE | Admit: 2013-09-09 | Discharge: 2013-09-09 | Disposition: A | Discharge status: Home / Self Care | Payer: BC Managed Care – PPO | Source: Ambulatory Visit | Attending: Family Medicine | Admitting: Family Medicine

## 2013-09-09 ENCOUNTER — Ambulatory Visit (INDEPENDENT_AMBULATORY_CARE_PROVIDER_SITE_OTHER): Payer: BC Managed Care – PPO | Admitting: Family Medicine

## 2013-09-09 VITALS — BP 126/92 | HR 78 | Temp 97.8°F | Ht 65.5 in | Wt 182.0 lb

## 2013-09-09 DIAGNOSIS — M545 Low back pain, unspecified: Secondary | ICD-10-CM | POA: Insufficient documentation

## 2013-09-09 DIAGNOSIS — F5104 Psychophysiologic insomnia: Secondary | ICD-10-CM | POA: Insufficient documentation

## 2013-09-09 DIAGNOSIS — Z124 Encounter for screening for malignant neoplasm of cervix: Secondary | ICD-10-CM

## 2013-09-09 DIAGNOSIS — G8929 Other chronic pain: Secondary | ICD-10-CM

## 2013-09-09 DIAGNOSIS — G4700 Insomnia, unspecified: Secondary | ICD-10-CM

## 2013-09-09 DIAGNOSIS — E7800 Pure hypercholesterolemia, unspecified: Secondary | ICD-10-CM

## 2013-09-09 DIAGNOSIS — Z1151 Encounter for screening for human papillomavirus (HPV): Secondary | ICD-10-CM | POA: Insufficient documentation

## 2013-09-09 DIAGNOSIS — Z0000 Encounter for general adult medical examination without abnormal findings: Secondary | ICD-10-CM

## 2013-09-09 DIAGNOSIS — I10 Essential (primary) hypertension: Secondary | ICD-10-CM

## 2013-09-09 DIAGNOSIS — R1013 Epigastric pain: Secondary | ICD-10-CM

## 2013-09-09 DIAGNOSIS — Z01419 Encounter for gynecological examination (general) (routine) without abnormal findings: Secondary | ICD-10-CM | POA: Insufficient documentation

## 2013-09-09 MED ORDER — LISINOPRIL 20 MG PO TABS: ORAL_TABLET | ORAL | Status: DC

## 2013-09-09 MED ORDER — ALPRAZOLAM 0.5 MG PO TABS: 0.5000 mg | ORAL_TABLET | Freq: Every evening | ORAL | Status: DC | PRN

## 2013-09-09 NOTE — Progress Notes (Signed)
Alprazolam 0.5 mg called in to [facility].

## 2013-09-09 NOTE — Patient Instructions (Addendum)
Work on [employer] and regular exercise. Start red yeast rice 1200 mg twice daily. Return for lab only visit in 6 months. Can try prilosec 20 mg to 40 mg daily x 2-4 weeks if epigastric pain not continuing to improve.

## 2013-09-09 NOTE — Progress Notes (Signed)
Subjective:    Patient ID: Abigail Stout, female    DOB: August 03, 1965, 48 y.o.   MRN: [id number]  HPI The patient is here for annual wellness exam and preventative care.    She is doing well overall.   She did have epigastric pain (burning, off and on) starting after recent trip to [place] 3 weeks ago.  No associated after meals.  No reflux. No diarrhea  She has used probiotic,antacid, ginger has improved some in last few weeks.  She has noted looser area on right side of rectum in last year. No constipation, no trouble getting out stool but if presses on right stool comes out faster.  Has hx of cystocele and pelvic sling 2011.  In past she has used methocarbamol prn. Uses only once every three months.  Has  Chronic low back pain off and on Hx of diskectomy in 2000.  Chronic insomnia: uses alprazolam prn using once every 3-4 months.  No daytime anxiety. No depression, NO SI.  Tylenol pM does not help without feeling groggy the next day.  She would like to GYN exam here this year.  Hypertension:  Moderate control on lisinopril 20 mg daily.  Using medication without problems or lightheadedness:  none Chest pain with exertion:none Edema:None Short of breath:none Average home BPs: 125/84 Other issues: 4 lb weight loss since last OV. Wt Readings from Last 3 Encounters:  09/09/13 182 lb (82.555 kg)  09/27/12 186 lb 8 oz (84.596 kg)  07/18/12 185 lb (83.915 kg)   She is working on [employer]. Exercise: off and on. Planning on 5 K.   Elevated Cholesterol: LDL at goal < 130 on no med. Lab Results  Component Value Date   CHOL 201* 09/04/2013   HDL 66.30 09/04/2013   LDLCALC 122* 09/04/2013   TRIG 62.0 09/04/2013   CHOLHDL 3 09/04/2013  Diet compliance:Good. Exercise: Moderate. Other complaints:    Review of Systems  Constitutional: Negative for fever and fatigue.  HENT: Negative for ear pain.   Eyes: Negative for pain.  Respiratory: Negative for chest tightness and  shortness of breath.   Cardiovascular: Negative for chest pain, palpitations and leg swelling.  Gastrointestinal: Negative for abdominal pain.  Genitourinary: Negative for dysuria.       Objective:   Physical Exam  Constitutional: Vital signs are normal. She appears well-developed and well-nourished. She is cooperative.  Non-toxic appearance. She does not appear ill. No distress.  HENT:  Head: Normocephalic.  Right Ear: Hearing, tympanic membrane, external ear and ear canal normal.  Left Ear: Hearing, tympanic membrane, external ear and ear canal normal.  Nose: Nose normal.  Eyes: Conjunctivae, EOM and lids are normal. Pupils are equal, round, and reactive to light. Lids are everted and swept, no foreign bodies found.  Neck: Trachea normal and normal range of motion. Neck supple. Carotid bruit is not present. No mass and no thyromegaly present.  Cardiovascular: Normal rate, regular rhythm, S1 normal, S2 normal, normal heart sounds and intact distal pulses.  Exam reveals no gallop.   No murmur heard. Pulmonary/Chest: Effort normal and breath sounds normal. No respiratory distress. She has no wheezes. She has no rhonchi. She has no rales.  Abdominal: Soft. Normal appearance and bowel sounds are normal. She exhibits no distension, no fluid wave, no abdominal bruit and no mass. There is no hepatosplenomegaly. There is no tenderness. There is no rebound, no guarding and no CVA tenderness. No hernia.  Genitourinary: Vagina normal and uterus normal. Rectal  exam shows no mass, no tenderness and anal tone normal. No breast swelling, tenderness, discharge or bleeding. Pelvic exam was performed with patient supine. There is no rash, tenderness or lesion on the right labia. There is no rash, tenderness or lesion on the left labia. Uterus is not enlarged and not tender. Cervix exhibits no motion tenderness, no discharge and no friability. Right adnexum displays no mass, no tenderness and no fullness. Left  adnexum displays no mass, no tenderness and no fullness.  Rectocele small present  Lymphadenopathy:    She has no cervical adenopathy.    She has no axillary adenopathy.  Neurological: She is alert. She has normal strength. No cranial nerve deficit or sensory deficit.  Skin: Skin is warm, dry and intact. No rash noted.  Psychiatric: Her speech is normal and behavior is normal. Judgment normal. Her mood appears not anxious. Cognition and memory are normal. She does not exhibit a depressed mood.    Nml rectal tone, no mass palpated      Assessment & Plan:  The patient's preventative maintenance and recommended screening tests for an annual wellness exam were reviewed in full today. Brought up to date unless services declined.  Counselled on the importance of diet, exercise, and its role in overall health and mortality. The patient's FH and SH was reviewed, including their home life, tobacco status, and drug and alcohol status.   Vaccines: Due for Tdap 2016 Colon: No early family  history.  PAP/DVE: Last pap 2013  Mammo: 06/2013

## 2013-09-09 NOTE — Progress Notes (Signed)
Pre visit review using our clinic review tool, if applicable. No additional management support is needed unless otherwise documented below in the visit note. 

## 2013-09-10 ENCOUNTER — Telehealth: Payer: Self-pay | Admitting: Family Medicine

## 2013-09-10 NOTE — Telephone Encounter (Signed)
Relevant patient education assigned to patient using Emmi. ° °

## 2013-09-11 ENCOUNTER — Encounter: Payer: Self-pay | Admitting: *Deleted

## 2013-09-17 DIAGNOSIS — R1013 Epigastric pain: Secondary | ICD-10-CM | POA: Insufficient documentation

## 2013-09-17 NOTE — Assessment & Plan Note (Signed)
Encouraged exercise, weight loss, healthy eating habits. Well controlled. Continue current medication.  

## 2013-09-17 NOTE — Assessment & Plan Note (Signed)
Start red yeast rice 1200 mg twice daily. Return for lab only visit in 6 months.

## 2013-09-17 NOTE — Assessment & Plan Note (Signed)
Trial of PPI. 

## 2014-05-08 ENCOUNTER — Other Ambulatory Visit: Payer: Self-pay

## 2014-05-08 MED ORDER — ALPRAZOLAM 0.5 MG PO TABS: 0.5000 mg | ORAL_TABLET | Freq: Every evening | ORAL | Status: DC | PRN

## 2014-05-08 NOTE — Telephone Encounter (Signed)
Alprazolam called to [facility].  Orders for Labs mailed to patient's home address.

## 2014-05-08 NOTE — Telephone Encounter (Signed)
Pt left v/m requesting written lab order for lab testing that is 6 month f/u from 09/09/13 office visit; pt wants to go to [facility]. Pt also request refill alprazolam to [facility]. Pt ins changes 05/2014.Please advise.

## 2014-05-08 NOTE — Telephone Encounter (Signed)
Dr. Diona Browner pt, will send to [facility]

## 2014-05-08 NOTE — Telephone Encounter (Signed)
Rx for labs in outbox

## 2014-06-15 ENCOUNTER — Other Ambulatory Visit (INDEPENDENT_AMBULATORY_CARE_PROVIDER_SITE_OTHER): Payer: BLUE CROSS/BLUE SHIELD

## 2014-06-15 DIAGNOSIS — E7800 Pure hypercholesterolemia, unspecified: Secondary | ICD-10-CM

## 2014-06-15 LAB — LIPID PANEL
Cholesterol: 184 mg/dL (ref 0–200)
HDL: 58.6 mg/dL (ref >39.00)
LDL CALC: 108 mg/dL — AB (ref 0–99)
NonHDL: 125.4
Total CHOL/HDL Ratio: 3
Triglycerides: 86 mg/dL (ref 0.0–149.0)
VLDL: 17.2 mg/dL (ref 0.0–40.0)

## 2014-06-15 LAB — COMPREHENSIVE METABOLIC PANEL
ALT: 11 U/L (ref 0–35)
AST: 15 U/L (ref 0–37)
Albumin: 3.9 g/dL (ref 3.5–5.2)
Alkaline Phosphatase: 44 U/L (ref 39–117)
BUN: 11 mg/dL (ref 6–23)
CALCIUM: 9.3 mg/dL (ref 8.4–10.5)
CO2: 27 mEq/L (ref 19–32)
Chloride: 106 mEq/L (ref 96–112)
Creatinine, Ser: 0.89 mg/dL (ref 0.40–1.20)
GFR: 71.42 mL/min (ref >60.00)
Glucose, Bld: 96 mg/dL (ref 70–99)
POTASSIUM: 4.2 meq/L (ref 3.5–5.1)
Sodium: 138 mEq/L (ref 135–145)
TOTAL PROTEIN: 6.5 g/dL (ref 6.0–8.3)
Total Bilirubin: 0.5 mg/dL (ref 0.2–1.2)

## 2014-06-16 ENCOUNTER — Encounter: Payer: Self-pay | Admitting: *Deleted

## 2014-09-10 ENCOUNTER — Other Ambulatory Visit: Payer: Self-pay

## 2014-09-10 DIAGNOSIS — Z1231 Encounter for screening mammogram for malignant neoplasm of breast: Secondary | ICD-10-CM

## 2014-09-15 NOTE — H&P (Signed)
    Subjective/Chief Complaint RLQ pain x 1 day    History of Present Illness [name] is a relatively healthy 49 yo F with a history of 1 day of RLQ pain.  She says that it began suddently yesterday approx 6 pm.  Associated with nausea/vomiting initially.  Has since subsided but still hurts when she moves.  No diarrhea/constipation. Has never had pain like this before.  + Chills initially (none currently).  No fevers, shortness of breath, cough, chest pain, shortness of breath.    Past History Uterine polyp removal Uterine ablation Breast reduction Liposuction Hypertension    Past Medical Health Hypertension   Past Med/Surgical Hx:  breast reduction:   uterine ablation:   ALLERGIES:  No Known Allergies:     Medications Lisinopril   Family and Social History:   Family History Diabetes Mellitus  Stroke    Social History negative tobacco, negative ETOH, negative Illicit drugs    Place of Living Home   Review of Systems:   Subjective/Chief Complaint RLQ pain    Fever/Chills Yes    Cough No    Sputum No    Abdominal Pain Yes    Diarrhea No    Constipation No    Nausea/Vomiting Yes    SOB/DOE No    Chest Pain No    Dysuria No   Physical Exam:   GEN well developed, no acute distress    HEENT pink conjunctivae    NECK No masses    RESP normal resp effort  clear BS  no use of accessory muscles    CARD regular rate  no murmur  no thrills    ABD positive tenderness  denies Flank Tenderness  no hernia  soft  normal BS  no Adominal Mass    SKIN normal to palpation    NEURO cranial nerves intact    PSYCH alert, A+O to time, place, person, good insight     Assessment/Admission Diagnosis 49 yo F with acute onset RLQ pain. Elevated WBC.  Findings consistent with acute appendicitis    Plan To OR for laparoscopic appendectomy   Electronic Signatures: Beckham Capistran, Glena Norfolk (MD)  (Signed 13-Sep-13 07:22)  Authored: CHIEF COMPLAINT and HISTORY, PAST  MEDICAL/SURGIAL HISTORY, ALLERGIES, OTHER MEDICATIONS, FAMILY AND SOCIAL HISTORY, REVIEW OF SYSTEMS, PHYSICAL EXAM, ASSESSMENT AND PLAN   Last Updated: 13-Sep-13 07:22 by Floyde Parkins (MD)

## 2014-09-15 NOTE — Op Note (Signed)
PATIENT NAME:  Abigail, Stout MR#:  [id number] DATE OF BIRTH:  1965/10/26  DATE OF PROCEDURE:  02/09/2012  PREOPERATIVE DIAGNOSIS: Acute appendicitis.   POSTOPERATIVE DIAGNOSIS:  Acute appendicitis.  PROCEDURE PERFORMED: Laparoscopic appendectomy.   ANESTHESIA: General.   ESTIMATED BLOOD LOSS:  10 mL.  SPECIMENS: Appendix.   INDICATION FOR SURGERY: [name] is a 49 year old female with acute onset right lower quadrant pain and nausea. She had a white cell count of 14,000 and a CT scan concerning for appendicitis. We therefore brought her to the operating room for laparoscopic appendectomy.   DETAILS OF PROCEDURE:  Informed consent was obtained. The patient was then brought to the operating room suite and laid supine on the operating room table. She was induced, endotracheal tube was placed, and general anesthesia was administered. Her abdomen was then prepped and draped in standard surgical fashion. A time-out was performed and we then correctly identified the patient's name, operative site, and procedure to be performed. A supraumbilical incision was made. It was deepened to the fascia. The fascia was grasped. The fascia was incised. A finger was used to enter the abdominal cavity. There were no adhesions. 0 Vicryl stay sutures were then placed through the fascia and a Hassan trocar was placed in the abdomen. The abdomen was insufflated. A 30-degree 10-mm scope was then placed through the [facility] trocar. The appendix was not visualized immediately. We then placed two 5-mm trocars in the left lower quadrant and suprapubic region. I then used graspers and found the appendix. It was acutely inflamed and very long with a long mesentery. I then used a [place] to make a hole in the mesoappendix at the base of the appendix. I then used a laparoscopic stapler to ligate the base of the appendix. I then used a combination of hook cautery and two red laparoscopic staple loads to take the mesoappendix.  There was a small area of bleeding following ligation in the mesoappendix. I used a laparoscopic clip applier to clip concerning sites on the mesoappendix to ensure hemostasis.  I then removed the appendix through the supraumbilical port using an endocatch bag. I then again made sure hemostasis was obtained and irrigated the abdomen. I then removed all ports under direct visualization. The supraumbilical fascia was then closed with a figure-of-eight 0 Vicryl suture. The skin was then closed at all port sites using interrupted 4-0 Monocryl deep dermal sutures. I then placed Steri-Strips, Telfa, and Tegaderm over the wounds. The patient was then awakened.  She was extubated. She was brought to the postanesthesia care unit in satisfactory condition. The needle, sponge, and instrument counts were correct at the end of the procedure. I was present and scrubbed for the entirety of the case.     ____________________________ Glena Norfolk. Ashleah Valtierra, MD cal:bjt D: 02/09/2012 13:09:58 ET T: 02/09/2012 13:30:02 ET JOB#: [id number]  cc: Harrell Gave A. Prakash Kimberling, MD, <Dictator> Floyde Parkins MD ELECTRONICALLY SIGNED 02/09/2012 16:45

## 2014-09-21 ENCOUNTER — Ambulatory Visit
Admission: RE | Admit: 2014-09-21 | Discharge: 2014-09-21 | Disposition: A | Discharge status: Home / Self Care | Payer: BLUE CROSS/BLUE SHIELD | Source: Ambulatory Visit

## 2014-09-21 DIAGNOSIS — Z1231 Encounter for screening mammogram for malignant neoplasm of breast: Secondary | ICD-10-CM

## 2014-10-05 ENCOUNTER — Telehealth: Payer: Self-pay | Admitting: Family Medicine

## 2014-10-05 NOTE — Telephone Encounter (Signed)
No need for repeat labs unless pt has a concern.

## 2014-10-05 NOTE — Telephone Encounter (Signed)
Pt scheduled cpe for July, she had las done in Jan.  Do you want labs done again before cpe?

## 2014-10-06 NOTE — Telephone Encounter (Signed)
Left message for Abigail Stout that no labs are needed prior to her physical unless there is something that she is concerned about.

## 2014-10-16 ENCOUNTER — Ambulatory Visit: Payer: Self-pay | Admitting: Family Medicine

## 2014-11-01 ENCOUNTER — Other Ambulatory Visit: Payer: Self-pay | Admitting: Family Medicine

## 2014-12-18 ENCOUNTER — Encounter: Payer: Self-pay | Admitting: Family Medicine

## 2014-12-18 ENCOUNTER — Encounter: Payer: Self-pay | Admitting: Gastroenterology

## 2014-12-18 ENCOUNTER — Ambulatory Visit (INDEPENDENT_AMBULATORY_CARE_PROVIDER_SITE_OTHER): Payer: BLUE CROSS/BLUE SHIELD | Admitting: Family Medicine

## 2014-12-18 ENCOUNTER — Telehealth: Payer: Self-pay | Admitting: *Deleted

## 2014-12-18 ENCOUNTER — Ambulatory Visit (INDEPENDENT_AMBULATORY_CARE_PROVIDER_SITE_OTHER)
Admission: RE | Admit: 2014-12-18 | Discharge: 2014-12-18 | Disposition: A | Discharge status: Home / Self Care | Payer: BLUE CROSS/BLUE SHIELD | Source: Ambulatory Visit | Attending: Family Medicine | Admitting: Family Medicine

## 2014-12-18 VITALS — BP 116/72 | HR 71 | Temp 98.6°F | Ht 65.5 in | Wt 180.0 lb

## 2014-12-18 DIAGNOSIS — Z23 Encounter for immunization: Secondary | ICD-10-CM

## 2014-12-18 DIAGNOSIS — E7800 Pure hypercholesterolemia, unspecified: Secondary | ICD-10-CM

## 2014-12-18 DIAGNOSIS — I10 Essential (primary) hypertension: Secondary | ICD-10-CM | POA: Diagnosis not present

## 2014-12-18 DIAGNOSIS — M25551 Pain in right hip: Secondary | ICD-10-CM

## 2014-12-18 DIAGNOSIS — Z1211 Encounter for screening for malignant neoplasm of colon: Secondary | ICD-10-CM

## 2014-12-18 DIAGNOSIS — Z0000 Encounter for general adult medical examination without abnormal findings: Secondary | ICD-10-CM

## 2014-12-18 MED ORDER — METAXALONE 800 MG PO TABS: 800.0000 mg | ORAL_TABLET | Freq: Three times a day (TID) | ORAL | Status: DC | PRN

## 2014-12-18 MED ORDER — DICLOFENAC SODIUM 75 MG PO TBEC: 75.0000 mg | DELAYED_RELEASE_TABLET | Freq: Two times a day (BID) | ORAL | Status: DC

## 2014-12-18 NOTE — Progress Notes (Signed)
The patient is here for annual wellness exam and preventative care.    Hypertension:   Well controlled on lisinopril. BP Readings from Last 3 Encounters:  12/18/14 116/72  09/09/13 126/92  09/27/12 120/72  Using medication without problems or lightheadedness:  None Chest pain with exertion:none Edema:none Short of breath:none Average home BPs: 118/70 Other issues: Wt Readings from Last 3 Encounters:  12/18/14 180 lb (81.647 kg)  09/09/13 182 lb (82.555 kg)  09/27/12 186 lb 8 oz (84.596 kg)   Body mass index is 29.49 kg/(m^2).   Elevated Cholesterol: LDL at goal < 130 on red yeast rice. Lab Results  Component Value Date   CHOL 184 06/15/2014   HDL 58.60 06/15/2014   LDLCALC 108* 06/15/2014   TRIG 86.0 06/15/2014   CHOLHDL 3 06/15/2014    She is working on [employer], healthy eating Exercise: off and on. Other complaints:   Right hip pain, chronically.  Pain increase with int/ext rotation.  Low back paibn Robaxin does not help. Ibuprofen 800 mg TID x 3 days.  Review of Systems  Constitutional: Negative for fever and fatigue.  HENT: Negative for ear pain.  Eyes: Negative for pain.  Respiratory: Negative for chest tightness and shortness of breath.  Cardiovascular: Negative for chest pain, palpitations and leg swelling.  Gastrointestinal: Negative for abdominal pain.  Genitourinary: Negative for dysuria.       Objective:   Physical Exam  Constitutional: Vital signs are normal. She appears well-developed and well-nourished. She is cooperative. Non-toxic appearance. She does not appear ill. No distress.  HENT:  Head: Normocephalic.  Right Ear: Hearing, tympanic membrane, external ear and ear canal normal.  Left Ear: Hearing, tympanic membrane, external ear and ear canal normal.  Nose: Nose normal.  Eyes: Conjunctivae, EOM and lids are normal. Pupils are equal, round, and reactive to light. Lids are everted and swept, no foreign bodies found.  Neck:  Trachea normal and normal range of motion. Neck supple. Carotid bruit is not present. No mass and no thyromegaly present.  Cardiovascular: Normal rate, regular rhythm, S1 normal, S2 normal, normal heart sounds and intact distal pulses. Exam reveals no gallop.  No murmur heard. Pulmonary/Chest: Effort normal and breath sounds normal. No respiratory distress. She has no wheezes. She has no rhonchi. She has no rales.  Abdominal: Soft. Normal appearance and bowel sounds are normal. She exhibits no distension, no fluid wave, no abdominal bruit and no mass. There is no hepatosplenomegaly. There is no tenderness. There is no rebound, no guarding and no CVA tenderness. No hernia.  Genitourinary: Vagina normal and uterus normal. Rectal exam shows no mass, no tenderness and anal tone normal. No breast swelling, tenderness, discharge or bleeding. Pelvic exam was performed with patient supine. There is no rash, tenderness or lesion on the right labia. There is no rash, tenderness or lesion on the left labia. Uterus is not enlarged and not tender. Cervix exhibits no motion tenderness, no discharge and no friability. Right adnexum displays no mass, no tenderness and no fullness. Left adnexum displays no mass, no tenderness and no fullness.  Rectocele small present  Lymphadenopathy:   She has no cervical adenopathy.   She has no axillary adenopathy.  Neurological: She is alert. She has normal strength. No cranial nerve deficit or sensory deficit.  Skin: Skin is warm, dry and intact. No rash noted.  Psychiatric: Her speech is normal and behavior is normal. Judgment normal. Her mood appears not anxious. Cognition and memory are normal. She  does not exhibit a depressed mood.  MSK: ttp over right lateral hip, pain with int and ext rotation, neg Faber's, neg SLR, no current ttp in low backM.  Nml rectal tone, no mass palpated      Assessment & Plan:  The patient's preventative maintenance and recommended  screening tests for an annual wellness exam were reviewed in full today. Brought up to date unless services declined.  Counselled on the importance of diet, exercise, and its role in overall health and mortality. The patient's FH and SH was reviewed, including their home life, tobacco status, and drug and alcohol status.   Vaccines: Given today Tdap. Colon: No early family history.  Referred to GI.  PAP/DVE: Last pap 2015 nml, repeat in 3 years, DVE yearly Mammo: 08/2014  STD screening: Refused.

## 2014-12-18 NOTE — Assessment & Plan Note (Signed)
Likely OA versus intermittent bursitis.  Will eval with X-ray.  Muscle relaxant as pt requests not clearly best treatment. Most likely will try trial of NSAIDs.

## 2014-12-18 NOTE — Patient Instructions (Addendum)
Stop at front desk to set up referral ro GI for colonoscopy.  Keep up working on healthy eating, regular exercise and weight loss.  We will call with X-ray resutls and recommendations.

## 2014-12-18 NOTE — Telephone Encounter (Signed)
Sent in rx, diclofenac and skelaxin.

## 2014-12-18 NOTE — Assessment & Plan Note (Signed)
At goal on red yeast rice. 

## 2014-12-18 NOTE — Telephone Encounter (Signed)
-----   Message from Jinny Sanders, MD sent at 12/18/2014 12:55 PM EDT ----- Notify pt no arthritis seen in hips bilaterally, no fracture.  I recommend course of diclofenac 75 mg twice daily 2 week for possible bursitis.  #30, 0RF  We can also try skelaxin during the day ( muscle relaxant less likely to make her sleepy) if she would like. Let me known if agreeable.

## 2014-12-18 NOTE — Telephone Encounter (Signed)
Abigail Stout notified as instructed by telephone.  She would like to try the Skelaxin but would like to only take it at night.  Will forward to Dr. Diona Browner to send in Rx for Skelaxin.  Diclofenac 75 mg has already be sent in to her pharmacy.

## 2014-12-18 NOTE — Assessment & Plan Note (Signed)
Well controlled. Continue current medication. Encouraged exercise, weight loss, healthy eating habits.  

## 2014-12-18 NOTE — Progress Notes (Signed)
Pre visit review using our clinic review tool, if applicable. No additional management support is needed unless otherwise documented below in the visit note. 

## 2015-01-15 ENCOUNTER — Encounter: Payer: Self-pay | Admitting: Family Medicine

## 2015-01-15 ENCOUNTER — Ambulatory Visit (INDEPENDENT_AMBULATORY_CARE_PROVIDER_SITE_OTHER): Payer: BLUE CROSS/BLUE SHIELD | Admitting: Family Medicine

## 2015-01-15 VITALS — BP 112/80 | HR 85 | Temp 97.9°F | Ht 65.5 in

## 2015-01-15 DIAGNOSIS — J309 Allergic rhinitis, unspecified: Secondary | ICD-10-CM

## 2015-01-15 MED ORDER — DICLOFENAC SODIUM 75 MG PO TBEC: 75.0000 mg | DELAYED_RELEASE_TABLET | Freq: Two times a day (BID) | ORAL | Status: DC | PRN

## 2015-01-15 NOTE — Progress Notes (Signed)
   Subjective:    Patient ID: Abigail Stout, female    DOB: 1965-09-21, 49 y.o.   MRN: [id number]  HPI   49 year old female with history of allergic rhinitis presents with  chest congestion, cough  for last 9 days  Non productive, but mucus rattling around.  Post nasal drip.  Some nasal congestion.  No sinus pain, pressure.  Rare ear pain bilaterally.  No fever, NO SOB, no wheeze.  No itchy eyes, occ sneeze,    Has tried zyrtec, guaifenesin.   Nonsmoker.  Seems to have  This same issue every year in august.   Review of Systems  Constitutional: Negative for fever and fatigue.  HENT: Negative for ear pain.   Eyes: Negative for pain.  Respiratory: Negative for chest tightness and shortness of breath.   Cardiovascular: Negative for chest pain, palpitations and leg swelling.  Gastrointestinal: Negative for abdominal pain.  Genitourinary: Negative for dysuria.       Objective:   Physical Exam  Constitutional: Vital signs are normal. She appears well-developed and well-nourished. She is cooperative.  Non-toxic appearance. She does not appear ill. No distress.  HENT:  Head: Normocephalic.  Right Ear: Hearing, tympanic membrane, external ear and ear canal normal. Tympanic membrane is not erythematous, not retracted and not bulging.  Left Ear: Hearing, tympanic membrane, external ear and ear canal normal. Tympanic membrane is not erythematous, not retracted and not bulging.  Nose: Mucosal edema and rhinorrhea present. Right sinus exhibits no maxillary sinus tenderness and no frontal sinus tenderness. Left sinus exhibits no maxillary sinus tenderness and no frontal sinus tenderness.  Mouth/Throat: Uvula is midline, oropharynx is clear and moist and mucous membranes are normal.  Eyes: Conjunctivae, EOM and lids are normal. Pupils are equal, round, and reactive to light. Lids are everted and swept, no foreign bodies found.  Neck: Trachea normal and normal range of motion. Neck  supple. Carotid bruit is not present. No thyroid mass and no thyromegaly present.  Cardiovascular: Normal rate, regular rhythm, S1 normal, S2 normal, normal heart sounds, intact distal pulses and normal pulses.  Exam reveals no gallop and no friction rub.   No murmur heard. Pulmonary/Chest: Effort normal and breath sounds normal. No tachypnea. No respiratory distress. She has no decreased breath sounds. She has no wheezes. She has no rhonchi. She has no rales.  Neurological: She is alert.  Skin: Skin is warm, dry and intact. No rash noted.  Psychiatric: Her speech is normal and behavior is normal. Judgment normal. Her mood appears not anxious. Cognition and memory are normal. She does not exhibit a depressed mood.          Assessment & Plan:

## 2015-01-15 NOTE — Progress Notes (Signed)
Pre visit review using our clinic review tool, if applicable. No additional management support is needed unless otherwise documented below in the visit note. 

## 2015-01-15 NOTE — Patient Instructions (Signed)
Can try vit C 500 mg daily  to prevent viral infections.  Can try allegra for allergies.  Can use flonase to decrease post nasal drip.

## 2015-01-22 NOTE — Assessment & Plan Note (Signed)
Change to allegra and add flonase.  If not improving can consider singulair.  Discussed starting allergy meds prior to season of allergies each year.

## 2015-01-28 ENCOUNTER — Other Ambulatory Visit: Payer: Self-pay | Admitting: Family Medicine

## 2015-02-11 ENCOUNTER — Ambulatory Visit (AMBULATORY_SURGERY_CENTER): Payer: Self-pay

## 2015-02-11 VITALS — Ht 66.0 in | Wt 188.0 lb

## 2015-02-11 DIAGNOSIS — Z1211 Encounter for screening for malignant neoplasm of colon: Secondary | ICD-10-CM

## 2015-02-11 MED ORDER — SUPREP BOWEL PREP KIT 17.5-3.13-1.6 GM/177ML PO SOLN: 1.0000 | Freq: Once | ORAL | Status: DC

## 2015-02-11 NOTE — Progress Notes (Signed)
No allergies to eggs or soy No past problems with anesthesia PONV No diet/weight loss meds No home oxygen  Has email  Emmi instructions given for colonoscopy

## 2015-02-23 ENCOUNTER — Telehealth: Payer: Self-pay | Admitting: Gastroenterology

## 2015-02-23 NOTE — Telephone Encounter (Signed)
Left a message for patient to return my call. 

## 2015-02-24 NOTE — Telephone Encounter (Signed)
Patient returned my call and I informed her we do have a free sample kit of Suprep here at the office if she can come by and pick it up. Pt states she will come by office tomorrow and pick up kit.

## 2015-02-24 NOTE — Telephone Encounter (Signed)
Left a message for patient to return my call. 

## 2015-02-26 ENCOUNTER — Ambulatory Visit (AMBULATORY_SURGERY_CENTER): Payer: BLUE CROSS/BLUE SHIELD | Admitting: Gastroenterology

## 2015-02-26 ENCOUNTER — Encounter: Payer: Self-pay | Admitting: Gastroenterology

## 2015-02-26 VITALS — BP 114/89 | HR 79 | Temp 98.4°F | Resp 13 | Ht 66.0 in | Wt 188.0 lb

## 2015-02-26 DIAGNOSIS — D123 Benign neoplasm of transverse colon: Secondary | ICD-10-CM | POA: Diagnosis not present

## 2015-02-26 DIAGNOSIS — Z1211 Encounter for screening for malignant neoplasm of colon: Secondary | ICD-10-CM | POA: Diagnosis not present

## 2015-02-26 DIAGNOSIS — D125 Benign neoplasm of sigmoid colon: Secondary | ICD-10-CM

## 2015-02-26 DIAGNOSIS — D122 Benign neoplasm of ascending colon: Secondary | ICD-10-CM | POA: Diagnosis not present

## 2015-02-26 MED ORDER — SODIUM CHLORIDE 0.9 % IV SOLN: 500.0000 mL | INTRAVENOUS | Status: DC

## 2015-02-26 NOTE — Patient Instructions (Signed)
YOU HAD AN ENDOSCOPIC PROCEDURE TODAY AT THE [facility]:   Refer to the procedure report that was given to you for any specific questions about what was found during the examination.  If the procedure report does not answer your questions, please call your gastroenterologist to clarify.  If you requested that your care partner not be given the details of your procedure findings, then the procedure report has been included in a sealed envelope for you to review at your convenience later.  YOU SHOULD EXPECT: Some feelings of bloating in the abdomen. Passage of more gas than usual.  Walking can help get rid of the air that was put into your GI tract during the procedure and reduce the bloating. If you had a lower endoscopy (such as a colonoscopy or flexible sigmoidoscopy) you may notice spotting of blood in your stool or on the toilet paper. If you underwent a bowel prep for your procedure, you may not have a normal bowel movement for a few days.  Please Note:  You might notice some irritation and congestion in your nose or some drainage.  This is from the oxygen used during your procedure.  There is no need for concern and it should clear up in a day or so.  SYMPTOMS TO REPORT IMMEDIATELY:   Following lower endoscopy (colonoscopy or flexible sigmoidoscopy):  Excessive amounts of blood in the stool  Significant tenderness or worsening of abdominal pains  Swelling of the abdomen that is new, acute  Fever of 100F or higher   For urgent or emergent issues, a gastroenterologist can be reached at any hour by calling [phone].   DIET: Your first meal following the procedure should be a small meal and then it is ok to progress to your normal diet. Heavy or fried foods are harder to digest and may make you feel nauseous or bloated.  Likewise, meals heavy in dairy and vegetables can increase bloating.  Drink plenty of fluids but you should avoid alcoholic beverages for 24 hours. Try to  increase the fiber in your diet.  ACTIVITY:  You should plan to take it easy for the rest of today and you should NOT DRIVE or use heavy machinery until tomorrow (because of the sedation medicines used during the test).    FOLLOW UP: Our staff will call the number listed on your records the next business day following your procedure to check on you and address any questions or concerns that you may have regarding the information given to you following your procedure. If we do not reach you, we will leave a message.  However, if you are feeling well and you are not experiencing any problems, there is no need to return our call.  We will assume that you have returned to your regular daily activities without incident.  If any biopsies were taken you will be contacted by phone or by letter within the next 1-3 weeks.  Please call us at [phone] if you have not heard about the biopsies in 3 weeks.    SIGNATURES/CONFIDENTIALITY: You and/or your care partner have signed paperwork which will be entered into your electronic medical record.  These signatures attest to the fact that that the information above on your After Visit Summary has been reviewed and is understood.  Full responsibility of the confidentiality of this discharge information lies with you and/or your care-partner.  Read all of the handouts given to you by your recovery room nurse. 

## 2015-02-26 NOTE — Progress Notes (Signed)
Transferred to recovery room. A/O x3, pleased with MAC.  VSS.  Report to Suzanne, RN. 

## 2015-02-26 NOTE — Op Note (Signed)
[place]  [address] [place], [address]   COLONOSCOPY PROCEDURE REPORT  PATIENT: Abigail Stout, Abigail Stout  MR#: [id number] BIRTHDATE: Aug 16, 1965 , 49  yrs. old GENDER: female ENDOSCOPIST: Ladene Artist, MD, [facility] REFERRED BY:Amy Cletis Athens, M.D. PROCEDURE DATE:  02/26/2015 PROCEDURE:   Colonoscopy, screening, Colonoscopy with biopsy, and Colonoscopy with snare polypectomy First Screening Colonoscopy - Avg.  risk and is 50 yrs.  old or older Yes.  Prior Negative Screening - Now for repeat screening. N/A  History of Adenoma - Now for follow-up colonoscopy & has been > or = to 3 yrs.  N/A  Polyps removed today? Yes ASA CLASS:   Class II INDICATIONS:Screening for colonic neoplasia and Colorectal Neoplasm Risk Assessment for this procedure is average risk. MEDICATIONS: Monitored anesthesia care and Propofol 250 mg IV DESCRIPTION OF PROCEDURE:   After the risks benefits and alternatives of the procedure were thoroughly explained, informed consent was obtained.  The digital rectal exam revealed no abnormalities of the rectum.   The LB [id number]  endoscope was introduced through the anus and advanced to the cecum, which was identified by both the appendix and ileocecal valve. No adverse events experienced.   The quality of the prep was good.  (Suprep was used)  The instrument was then slowly withdrawn as the colon was fully examined. Estimated blood loss is zero unless otherwise noted in this procedure report.  COLON FINDINGS: Two sessile polyps measuring 7 mm in size were found in the transverse colon and ascending colon.  Polypectomies were performed with a cold snare.  The resection was complete, the polyp tissue was completely retrieved and sent to histology.   A sessile polyp measuring 4 mm in size was found in the sigmoid colon.  A polypectomy was performed with cold forceps.  The resection was complete, the polyp tissue was completely retrieved and sent  to histology.   There was mild diverticulosis noted in the sigmoid colon.   The examination was otherwise normal.  Retroflexed views revealed internal Grade I hemorrhoids. The time to cecum = 2.6 Withdrawal time = 14.2   The scope was withdrawn and the procedure completed. COMPLICATIONS: There were no immediate complications.  ENDOSCOPIC IMPRESSION: 1.   Two sessile polyps in the transverse colon and ascending colon; polypectomies performed with a cold snare 2.   Sessile polyp in the sigmoid colon; polypectomy performed with cold forceps 3.   Mild diverticulosis in the sigmoid colon 4.   Grade l internal hemorrhoids  RECOMMENDATIONS: 1.  Await pathology results 2.  Repeat colonoscopy in 5 years if polyp(s) precancerous; otherwise 10 years 3.  High fiber diet with liberal fluid intake.  eSigned:  Ladene Artist, MD, [facility] 02/26/2015 10:49 AM

## 2015-02-26 NOTE — Progress Notes (Signed)
Called to room to assist during endoscopic procedure.  Patient ID and intended procedure confirmed with present staff. Received instructions for my participation in the procedure from the performing physician.  

## 2015-03-01 ENCOUNTER — Telehealth: Payer: Self-pay | Admitting: *Deleted

## 2015-03-01 NOTE — Telephone Encounter (Signed)
  Follow up Call-  Call back number 02/26/2015  Post procedure Call Back phone  # [phone]  Permission to leave phone message Yes     Patient questions:  Do you have a fever, pain , or abdominal swelling? No. Pain Score  0 *  Have you tolerated food without any problems? Yes.    Have you been able to return to your normal activities? Yes.    Do you have any questions about your discharge instructions: Diet   No. Medications  No. Follow up visit  No.  Do you have questions or concerns about your Care? No.  Actions: * If pain score is 4 or above: No action needed, pain <4.

## 2015-03-02 ENCOUNTER — Encounter: Payer: Self-pay | Admitting: Gastroenterology

## 2015-08-09 ENCOUNTER — Other Ambulatory Visit: Payer: Self-pay | Admitting: Family Medicine

## 2015-11-02 ENCOUNTER — Encounter: Payer: Self-pay | Admitting: Family Medicine

## 2015-11-02 MED ORDER — LISINOPRIL 20 MG PO TABS: 20.0000 mg | ORAL_TABLET | Freq: Every day | ORAL | Status: DC

## 2015-11-08 ENCOUNTER — Other Ambulatory Visit: Payer: Self-pay | Admitting: Family Medicine

## 2015-11-08 DIAGNOSIS — Z1231 Encounter for screening mammogram for malignant neoplasm of breast: Secondary | ICD-10-CM

## 2015-12-03 ENCOUNTER — Ambulatory Visit
Admission: RE | Admit: 2015-12-03 | Discharge: 2015-12-03 | Disposition: A | Discharge status: Home / Self Care | Payer: No Typology Code available for payment source | Source: Ambulatory Visit | Attending: Family Medicine | Admitting: Family Medicine

## 2015-12-03 DIAGNOSIS — Z1231 Encounter for screening mammogram for malignant neoplasm of breast: Secondary | ICD-10-CM

## 2015-12-17 ENCOUNTER — Other Ambulatory Visit (INDEPENDENT_AMBULATORY_CARE_PROVIDER_SITE_OTHER): Payer: Self-pay

## 2015-12-17 ENCOUNTER — Telehealth: Payer: Self-pay | Admitting: Family Medicine

## 2015-12-17 DIAGNOSIS — E7800 Pure hypercholesterolemia, unspecified: Secondary | ICD-10-CM

## 2015-12-17 LAB — LIPID PANEL
CHOLESTEROL: 211 mg/dL — AB (ref 0–200)
HDL: 52.8 mg/dL (ref >39.00)
LDL CALC: 141 mg/dL — AB (ref 0–99)
NonHDL: 158.18
TRIGLYCERIDES: 85 mg/dL (ref 0.0–149.0)
Total CHOL/HDL Ratio: 4
VLDL: 17 mg/dL (ref 0.0–40.0)

## 2015-12-17 LAB — COMPREHENSIVE METABOLIC PANEL
ALT: 18 U/L (ref 0–35)
AST: 18 U/L (ref 0–37)
Albumin: 4.1 g/dL (ref 3.5–5.2)
Alkaline Phosphatase: 48 U/L (ref 39–117)
BILIRUBIN TOTAL: 0.6 mg/dL (ref 0.2–1.2)
BUN: 21 mg/dL (ref 6–23)
CHLORIDE: 104 meq/L (ref 96–112)
CO2: 27 meq/L (ref 19–32)
CREATININE: 1.14 mg/dL (ref 0.40–1.20)
Calcium: 9.5 mg/dL (ref 8.4–10.5)
GFR: 53.35 mL/min — AB (ref >60.00)
Glucose, Bld: 93 mg/dL (ref 70–99)
POTASSIUM: 4.4 meq/L (ref 3.5–5.1)
Sodium: 138 mEq/L (ref 135–145)
Total Protein: 7 g/dL (ref 6.0–8.3)

## 2015-12-17 NOTE — Telephone Encounter (Signed)
-----   Message from Ellamae Sia sent at 12/09/2015 10:24 AM EDT ----- Regarding: Lab orders for Friday, 7.21.17 Patient is scheduled for CPX labs, please order future labs, Thanks , Karna Christmas

## 2015-12-24 ENCOUNTER — Encounter: Payer: BLUE CROSS/BLUE SHIELD | Admitting: Family Medicine

## 2015-12-31 ENCOUNTER — Ambulatory Visit (INDEPENDENT_AMBULATORY_CARE_PROVIDER_SITE_OTHER): Payer: Self-pay | Admitting: Family Medicine

## 2015-12-31 ENCOUNTER — Encounter: Payer: Self-pay | Admitting: Family Medicine

## 2015-12-31 VITALS — BP 112/80 | HR 104 | Temp 98.0°F | Ht 65.25 in | Wt 193.5 lb

## 2015-12-31 DIAGNOSIS — G4700 Insomnia, unspecified: Secondary | ICD-10-CM

## 2015-12-31 DIAGNOSIS — E7800 Pure hypercholesterolemia, unspecified: Secondary | ICD-10-CM

## 2015-12-31 DIAGNOSIS — M545 Low back pain, unspecified: Secondary | ICD-10-CM

## 2015-12-31 DIAGNOSIS — I10 Essential (primary) hypertension: Secondary | ICD-10-CM

## 2015-12-31 DIAGNOSIS — G8929 Other chronic pain: Secondary | ICD-10-CM

## 2015-12-31 DIAGNOSIS — F5104 Psychophysiologic insomnia: Secondary | ICD-10-CM

## 2015-12-31 DIAGNOSIS — Z0000 Encounter for general adult medical examination without abnormal findings: Secondary | ICD-10-CM

## 2015-12-31 MED ORDER — DICLOFENAC SODIUM 75 MG PO TBEC: 75.0000 mg | DELAYED_RELEASE_TABLET | Freq: Two times a day (BID) | ORAL | 0 refills | Status: DC | PRN

## 2015-12-31 NOTE — Patient Instructions (Addendum)
Continue working on exercise and healthy eating. Take red yeast rice.  if you would like to return earlier for cholesterol check in 3 months to see if lifestyle changes are working.. That is fine. Use diclofenac as needed for inflammation. Limit use to not irritate kidney function.  Drink water  8 x 8 oz daily. Continue massage, weight loss. Continue core strengthening.

## 2015-12-31 NOTE — Assessment & Plan Note (Signed)
Use diclofenas as needed for inflammation. Continue massage, weight loss. Continue core strengthening.

## 2015-12-31 NOTE — Progress Notes (Signed)
Pre visit review using our clinic review tool, if applicable. No additional management support is needed unless otherwise documented below in the visit note. 

## 2015-12-31 NOTE — Assessment & Plan Note (Addendum)
No longer at goal, with pt weight gain. Encouraged exercise, weight loss, healthy eating habits.  restart red yeast rice.

## 2015-12-31 NOTE — Assessment & Plan Note (Signed)
Well controlled. Continue current medication.  

## 2015-12-31 NOTE — Assessment & Plan Note (Signed)
Use 1/2 tab of xanax at bedtime for sleep.. Once every 2 mnths. Trys to limit use.  Working on healthy sleep hygeine instead, still likes to have Rx on hand.  

## 2015-12-31 NOTE — Progress Notes (Signed)
The patient is here for annual wellness exam and preventative care.    Hypertension:   Well controlled on lisinopril. BP Readings from Last 3 Encounters:  12/31/15 112/80  02/26/15 114/89  01/15/15 112/80  Using medication without problems or lightheadedness:  None Chest pain with exertion:none Edema:none Short of breath:none Average home BPs: not checking. Other issues: Wt Readings from Last 3 Encounters:  12/31/15 193 lb 8 oz (87.8 kg)  02/26/15 188 lb (85.3 kg)  02/11/15 188 lb (85.3 kg)   Elevated Cholesterol:  No longer at LDL at goal < 130 on red yeast rice ( had stopped when in [place]). Lab Results  Component Value Date   CHOL 211 (H) 12/17/2015   HDL 52.80 12/17/2015   LDLCALC 141 (H) 12/17/2015   TRIG 85.0 12/17/2015   CHOLHDL 4 12/17/2015  She is working on low chol diet, healthy eating Exercise: She has been walking 3 miles a day 5 days a week x last 3 weeks. Other complaints:   Right hip pain, chronically. Low back pain  Pain increase with int/ext rotation. Using muscle relaxant as need. Using diclofenac as needed. She has started massage therapist 2-3 times a week.  Social History /Family History/Past Medical History reviewed and updated if needed.  Review of Systems  Please review HPI for additional review of systems. Constitutional: Negative for fever and fatigue.  HENT: Negative for ear pain.  Eyes: Negative for pain.  Respiratory: Negative for chest tightness and shortness of breath.  Cardiovascular: Negative for chest pain, palpitations and leg swelling.  Gastrointestinal: Negative for abdominal pain.  Genitourinary: Negative for dysuria.       Objective:   Physical Exam  Constitutional: Vital signs are normal. She appears well-developed and well-nourished. She is cooperative. Non-toxic appearance. She does not appear ill. No distress.  HENT:  Head: Normocephalic.  Right Ear: Hearing, tympanic membrane, external ear and ear canal  normal.  Left Ear: Hearing, tympanic membrane, external ear and ear canal normal.  Nose: Nose normal.  Eyes: Conjunctivae, EOM and lids are normal. Pupils are equal, round, and reactive to light. Lids are everted and swept, no foreign bodies found.  Neck: Trachea normal and normal range of motion. Neck supple. Carotid bruit is not present. No mass and no thyromegaly present.  Cardiovascular: Normal rate, regular rhythm, S1 normal, S2 normal, normal heart sounds and intact distal pulses. Exam reveals no gallop.  No murmur heard. Pulmonary/Chest: Effort normal and breath sounds normal. No respiratory distress. She has no wheezes. She has no rhonchi. She has no rales.  Abdominal: Soft. Normal appearance and bowel sounds are normal. She exhibits no distension, no fluid wave, no abdominal bruit and no mass. There is no hepatosplenomegaly. There is no tenderness. There is no rebound, no guarding and no CVA tenderness. No hernia.  Genitourinary: Vagina normal and uterus normal. Rectal exam shows no mass, no tenderness and anal tone normal. No breast swelling, tenderness, discharge or bleeding. Pelvic exam was performed with patient supine. There is no rash, tenderness or lesion on the right labia. There is no rash, tenderness or lesion on the left labia. Uterus is not enlarged and not tender.NO PAP PERFORMED Left adnexum displays no mass, no tenderness and no fullness.  Rectocele small present  Lymphadenopathy:   She has no cervical adenopathy.   She has no axillary adenopathy.  Neurological: She is alert. She has normal strength. No cranial nerve deficit or sensory deficit.  Skin: Skin is warm, dry and intact. No  rash noted.  Psychiatric: Her speech is normal and behavior is normal. Judgment normal. Her mood appears not anxious. Cognition and memory are normal. She does not exhibit a depressed mood.  MSK: ttp over right lateral hip, pain with int and ext rotation, neg Faber's, neg SLR, no current  ttp in low back.  Nml rectal tone, no mass palpated      Assessment & Plan:  The patient's preventative maintenance and recommended screening tests for an annual wellness exam were reviewed in full today. Brought up to date unless services declined.  Counselled on the importance of diet, exercise, and its role in overall health and mortality. The patient's FH and SH was reviewed, including their home life, tobacco status, and drug and alcohol status.   Vaccines:Tdap. Colon: No early family history.  Colonoscopy:   01/2015 DrMarland Kitchen Fuller Plan polyps: recommend repeat in 5 year  PAP/DVE: Last pap 2016 nml, neg co-testing, repeat in  5 years, DVE yearly Mammo: 11/2015  STD screening: Refused.

## 2016-03-07 ENCOUNTER — Encounter: Payer: Self-pay | Admitting: Family Medicine

## 2016-03-07 MED ORDER — LISINOPRIL 20 MG PO TABS: 20.0000 mg | ORAL_TABLET | Freq: Every day | ORAL | 3 refills | Status: DC

## 2016-03-07 NOTE — Telephone Encounter (Signed)
Please refill for pt for 1 year lisinopril [facility].  Look into vaccines as well.

## 2016-03-07 NOTE — Telephone Encounter (Signed)
Refill sent to Abigail Stout.  She will need to set up automatic refill herself with [facility]. We have nothing to do with that.

## 2016-03-27 ENCOUNTER — Encounter: Payer: Self-pay | Admitting: Family Medicine

## 2016-03-27 NOTE — Telephone Encounter (Signed)
Rose,  Can you let this patent know the cost of a Hepatitis A vaccine.

## 2016-03-28 NOTE — Telephone Encounter (Signed)
Butch Penny.. Can you forward this to the correct person who can give her a quote?

## 2016-06-08 ENCOUNTER — Encounter: Payer: Self-pay | Admitting: Family Medicine

## 2016-08-04 LAB — HM PAP SMEAR

## 2016-11-28 ENCOUNTER — Telehealth: Payer: No Typology Code available for payment source | Admitting: Family

## 2016-11-28 DIAGNOSIS — B9689 Other specified bacterial agents as the cause of diseases classified elsewhere: Secondary | ICD-10-CM

## 2016-11-28 DIAGNOSIS — J208 Acute bronchitis due to other specified organisms: Secondary | ICD-10-CM

## 2016-11-28 MED ORDER — METHYLPREDNISOLONE 4 MG PO TABS: 4.0000 mg | ORAL_TABLET | Freq: Every day | ORAL | 0 refills | Status: DC

## 2016-11-28 MED ORDER — DOXYCYCLINE HYCLATE 100 MG PO TABS: 100.0000 mg | ORAL_TABLET | Freq: Two times a day (BID) | ORAL | 0 refills | Status: DC

## 2016-11-28 NOTE — Progress Notes (Signed)
We are sorry that you are not feeling well.  Here is how we plan to help!  Based on your presentation I believe you most likely have A cough due to bacteria.  When patients have a fever and a productive cough with a change in color or increased sputum production, we are concerned about bacterial bronchitis.  If left untreated it can progress to pneumonia.  If your symptoms do not improve with your treatment plan it is important that you contact your provider.   I hve prescribed Doxycycline 100 mg twice a day for 7 days     In addition you may use A non-prescription cough medication called Robitussin DAC. Take 2 teaspoons every 8 hours or Delsym: take 2 teaspoons every 12 hours.  Sterapred 5 mg dosepak  From your responses in the eVisit questionnaire you describe inflammation in the upper respiratory tract which is causing a significant cough.  This is commonly called Bronchitis and has four common causes:    Allergies  Viral Infections  Acid Reflux  Bacterial Infection Allergies, viruses and acid reflux are treated by controlling symptoms or eliminating the cause. An example might be a cough caused by taking certain blood pressure medications. You stop the cough by changing the medication. Another example might be a cough caused by acid reflux. Controlling the reflux helps control the cough.  USE OF BRONCHODILATOR ("RESCUE") INHALERS: There is a risk from using your bronchodilator too frequently.  The risk is that over-reliance on a medication which only relaxes the muscles surrounding the breathing tubes can reduce the effectiveness of medications prescribed to reduce swelling and congestion of the tubes themselves.  Although you feel brief relief from the bronchodilator inhaler, your asthma may actually be worsening with the tubes becoming more swollen and filled with mucus.  This can delay other crucial treatments, such as oral steroid medications. If you need to use a bronchodilator inhaler  daily, several times per day, you should discuss this with your provider.  There are probably better treatments that could be used to keep your asthma under control.     HOME CARE . Only take medications as instructed by your medical team. . Complete the entire course of an antibiotic. . Drink plenty of fluids and get plenty of rest. . Avoid close contacts especially the very young and the elderly . Cover your mouth if you cough or cough into your sleeve. . Always remember to wash your hands . A steam or ultrasonic humidifier can help congestion.   GET HELP RIGHT AWAY IF: . You develop worsening fever. . You become short of breath . You cough up blood. . Your symptoms persist after you have completed your treatment plan MAKE SURE YOU   Understand these instructions.  Will watch your condition.  Will get help right away if you are not doing well or get worse.  Your e-visit answers were reviewed by a board certified advanced clinical practitioner to complete your personal care plan.  Depending on the condition, your plan could have included both over the counter or prescription medications. If there is a problem please reply  once you have received a response from your provider. Your safety is important to [place].  If you have drug allergies check your prescription carefully.    You can use MyChart to ask questions about today's visit, request a non-urgent call back, or ask for a work or school excuse for 24 hours related to this e-Visit. If it has been greater  than 24 hours you will need to follow up with your provider, or enter a new e-Visit to address those concerns. You will get an e-mail in the next two days asking about your experience.  I hope that your e-visit has been valuable and will speed your recovery. Thank you for using e-visits.

## 2016-12-29 ENCOUNTER — Other Ambulatory Visit (INDEPENDENT_AMBULATORY_CARE_PROVIDER_SITE_OTHER): Payer: Self-pay

## 2016-12-29 ENCOUNTER — Telehealth: Payer: Self-pay | Admitting: Family Medicine

## 2016-12-29 DIAGNOSIS — E7800 Pure hypercholesterolemia, unspecified: Secondary | ICD-10-CM

## 2016-12-29 LAB — COMPREHENSIVE METABOLIC PANEL
ALBUMIN: 4.4 g/dL (ref 3.5–5.2)
ALK PHOS: 53 U/L (ref 39–117)
ALT: 46 U/L — ABNORMAL HIGH (ref 0–35)
AST: 29 U/L (ref 0–37)
BUN: 13 mg/dL (ref 6–23)
CALCIUM: 9.8 mg/dL (ref 8.4–10.5)
CHLORIDE: 100 meq/L (ref 96–112)
CO2: 32 mEq/L (ref 19–32)
Creatinine, Ser: 1.18 mg/dL (ref 0.40–1.20)
GFR: 51.06 mL/min — AB (ref >60.00)
Glucose, Bld: 91 mg/dL (ref 70–99)
POTASSIUM: 4.4 meq/L (ref 3.5–5.1)
SODIUM: 136 meq/L (ref 135–145)
TOTAL PROTEIN: 7.4 g/dL (ref 6.0–8.3)
Total Bilirubin: 0.9 mg/dL (ref 0.2–1.2)

## 2016-12-29 LAB — LIPID PANEL
CHOLESTEROL: 250 mg/dL — AB (ref 0–200)
HDL: 59 mg/dL (ref >39.00)
LDL CALC: 165 mg/dL — AB (ref 0–99)
NonHDL: 190.67
TRIGLYCERIDES: 130 mg/dL (ref 0.0–149.0)
Total CHOL/HDL Ratio: 4
VLDL: 26 mg/dL (ref 0.0–40.0)

## 2016-12-29 NOTE — Telephone Encounter (Signed)
-----   Message from Marchia Bond sent at 12/20/2016 10:58 AM EDT ----- Regarding: Cpx labs Fri 8/3, need orders. Thanks :-) Please order  future cpx labs for pt's upcoming lab appt. Thanks Aniceto Boss

## 2017-01-05 ENCOUNTER — Ambulatory Visit (INDEPENDENT_AMBULATORY_CARE_PROVIDER_SITE_OTHER): Payer: Self-pay | Admitting: Family Medicine

## 2017-01-05 ENCOUNTER — Encounter: Payer: Self-pay | Admitting: Family Medicine

## 2017-01-05 VITALS — BP 110/80 | HR 83 | Temp 98.7°F | Ht 65.0 in | Wt 191.8 lb

## 2017-01-05 DIAGNOSIS — R740 Nonspecific elevation of levels of transaminase and lactic acid dehydrogenase [LDH]: Secondary | ICD-10-CM

## 2017-01-05 DIAGNOSIS — R7401 Elevation of levels of liver transaminase levels: Secondary | ICD-10-CM

## 2017-01-05 DIAGNOSIS — Z0000 Encounter for general adult medical examination without abnormal findings: Secondary | ICD-10-CM

## 2017-01-05 DIAGNOSIS — E7800 Pure hypercholesterolemia, unspecified: Secondary | ICD-10-CM

## 2017-01-05 DIAGNOSIS — F5104 Psychophysiologic insomnia: Secondary | ICD-10-CM

## 2017-01-05 DIAGNOSIS — G8929 Other chronic pain: Secondary | ICD-10-CM

## 2017-01-05 DIAGNOSIS — M545 Low back pain: Secondary | ICD-10-CM

## 2017-01-05 DIAGNOSIS — I10 Essential (primary) hypertension: Secondary | ICD-10-CM

## 2017-01-05 DIAGNOSIS — H6983 Other specified disorders of Eustachian tube, bilateral: Secondary | ICD-10-CM | POA: Insufficient documentation

## 2017-01-05 DIAGNOSIS — R944 Abnormal results of kidney function studies: Secondary | ICD-10-CM

## 2017-01-05 DIAGNOSIS — H6993 Unspecified Eustachian tube disorder, bilateral: Secondary | ICD-10-CM | POA: Insufficient documentation

## 2017-01-05 MED ORDER — METAXALONE 800 MG PO TABS: 800.0000 mg | ORAL_TABLET | Freq: Three times a day (TID) | ORAL | 0 refills | Status: DC | PRN

## 2017-01-05 MED ORDER — ALPRAZOLAM 0.5 MG PO TABS: 0.5000 mg | ORAL_TABLET | Freq: Every evening | ORAL | 0 refills | Status: DC | PRN

## 2017-01-05 NOTE — Assessment & Plan Note (Signed)
Inadequate control but on red yeast rice.Marland Kitchen AHA 10 year risk cal: 1.8%.. No indication for statin. No family history of CAD.

## 2017-01-05 NOTE — Assessment & Plan Note (Signed)
Use skelaxin instead of NSAIDs or tylenol.

## 2017-01-05 NOTE — Progress Notes (Signed)
Subjective:    Patient ID: Abigail Stout, female    DOB: 08/25/1965, 51 y.o.   MRN: [id number]  HPI  The patient is here for annual wellness exam and preventative care.    She has been having cough and congestion since June. Tried multiple OTC meds.   Had visit in 11/2016 Dx with bronchitis.Marland Kitchen treated with doxycyline and prednisone.   She still feels like she has irritation in lungs when she takes deep breaths.  Cough resolved.  No wheezing. No fever. No SOB.  She has had off and on ear pain  As well.  No history of asthma.  She has been more anxious lately given has a presentation tommorow. Denies depression,  No insomnia. She does use alprazolam prior to presentations.. Has used 30 in 3 years.  Elevated LFTs.Marland Kitchen ALT isolated.. No ETOH, low fat diet, obesity.  [place]  Hypertension:   Good control on lisinopril.  Using medication without problems or lightheadedness:  none Chest pain with exertion: none Edema: none Short of breath: none Average home BPs: not checking Other issues: Body mass index is 31.91 kg/m. Wt Readings from Last 3 Encounters:  01/05/17 191 lb 12 oz (87 kg)  12/31/15 193 lb 8 oz (87.8 kg)  02/26/15 188 lb (85.3 kg)    Slight decrease in GFR in last 2 years.. Drinks a lot of water.  Does use diclofenac 1-2 times a month.  Rarely using aleve about 4 times a month.  Elevated Cholesterol:  LDL not at goal.. She has been very good with taking red yeast rice max.. No SE.   1.8% 10 year risk per AHA risk calculator. Lab Results  Component Value Date   CHOL 250 (H) 12/29/2016   HDL 59.00 12/29/2016   LDLCALC 165 (H) 12/29/2016   TRIG 130.0 12/29/2016   CHOLHDL 4 12/29/2016  Using medications without problems: Muscle aches:  Diet compliance: low cholesterol diet.Exercise: None Other complaints:    Social History /Family History/Past Medical History reviewed in detail and updated in EMR if needed. Blood pressure  110/80, pulse 83, temperature 98.7 F (37.1 C), temperature source Oral, height 5\' 5"  (1.651 m), weight 191 lb 12 oz (87 kg).  Review of Systems  Constitutional: Negative for fatigue and fever.  HENT: Positive for congestion and ear pain.   Eyes: Negative for pain.  Respiratory: Negative for cough and shortness of breath.   Cardiovascular: Negative for chest pain, palpitations and leg swelling.  Gastrointestinal: Negative for abdominal pain.  Genitourinary: Negative for dysuria and vaginal bleeding.  Musculoskeletal: Negative for back pain.  Neurological: Negative for syncope, light-headedness and headaches.  Psychiatric/Behavioral: Negative for dysphoric mood.       Objective:   Physical Exam  Constitutional: Vital signs are normal. She appears well-developed and well-nourished. She is cooperative.  Non-toxic appearance. She does not appear ill. No distress.  HENT:  Head: Normocephalic.  Right Ear: Hearing, external ear and ear canal normal. Tympanic membrane is not erythematous, not retracted and not bulging. A middle ear effusion is present.  Left Ear: Hearing, external ear and ear canal normal. Tympanic membrane is not erythematous, not retracted and not bulging. A middle ear effusion is present.  Nose: Nose normal. No mucosal edema or rhinorrhea. Right sinus exhibits no maxillary sinus tenderness and no frontal sinus tenderness. Left sinus exhibits no maxillary sinus tenderness and no frontal sinus tenderness.  Mouth/Throat: Uvula is midline, oropharynx is clear and moist and mucous  membranes are normal.  Eyes: Pupils are equal, round, and reactive to light. Conjunctivae, EOM and lids are normal. Lids are everted and swept, no foreign bodies found.  Neck: Trachea normal and normal range of motion. Neck supple. Carotid bruit is not present. No thyroid mass and no thyromegaly present.  Cardiovascular: Normal rate, regular rhythm, S1 normal, S2 normal, normal heart sounds, intact distal  pulses and normal pulses.  Exam reveals no gallop and no friction rub.   No murmur heard. Pulmonary/Chest: Effort normal and breath sounds normal. No tachypnea. No respiratory distress. She has no decreased breath sounds. She has no wheezes. She has no rhonchi. She has no rales.  Abdominal: Soft. Normal appearance and bowel sounds are normal. She exhibits no distension, no fluid wave, no abdominal bruit and no mass. There is no hepatosplenomegaly. There is no tenderness. There is no rebound, no guarding and no CVA tenderness. No hernia.  Lymphadenopathy:    She has no cervical adenopathy.    She has no axillary adenopathy.  Neurological: She is alert. She has normal strength. No cranial nerve deficit or sensory deficit.  Skin: Skin is warm, dry and intact. No rash noted.  Psychiatric: Her speech is normal and behavior is normal. Judgment and thought content normal. Her mood appears not anxious. Cognition and memory are normal. She does not exhibit a depressed mood.          Assessment & Plan:  The patient's preventative maintenance and recommended screening tests for an annual wellness exam were reviewed in full today. Brought up to date unless services declined.  Counselled on the importance of diet, exercise, and its role in overall health and mortality. The patient's FH and SH was reviewed, including their home life, tobacco status, and drug and alcohol status.   Vaccines:Tdap uptodate Colon: No early family history. Colonoscopy:   01/2015 DrMarland Kitchen Fuller Plan polyps: recommend repeat in 5 year  PAP/DVE: Last pap per GYN Mammo: 11/2015 STD screening: Refused.

## 2017-01-05 NOTE — Assessment & Plan Note (Signed)
Cr nml. Increase water, decrease NSAIDs.Marland Kitchen recheck next year.

## 2017-01-05 NOTE — Patient Instructions (Addendum)
Continue to limit aleve and diclofenac and ibuprofen.  Use skelaxin instead for back pain.   Get back to exercise.  Decrease eggs, creamer etc.  Set up mammogram when able.  Call if interested in checking chol and liver labs in next 3-6 months.

## 2017-01-05 NOTE — Assessment & Plan Note (Signed)
Start nasal steroid spray 2 sprays per nostril daily.

## 2017-01-05 NOTE — Assessment & Plan Note (Signed)
Using Xanax occ  For sleep night prior to presentation

## 2017-01-05 NOTE — Assessment & Plan Note (Signed)
Isolated. Avoid live irritants and follow over time.  No ETOH, no tylenol and no family history of liver issues.

## 2017-01-05 NOTE — Assessment & Plan Note (Signed)
Well controlled. Continue current medication.  

## 2017-01-24 ENCOUNTER — Encounter: Payer: Self-pay | Admitting: Family Medicine

## 2017-03-20 ENCOUNTER — Other Ambulatory Visit: Payer: Self-pay | Admitting: Family Medicine

## 2017-08-10 ENCOUNTER — Other Ambulatory Visit: Payer: Self-pay | Admitting: Family Medicine

## 2017-08-10 DIAGNOSIS — Z1231 Encounter for screening mammogram for malignant neoplasm of breast: Secondary | ICD-10-CM

## 2017-08-28 ENCOUNTER — Ambulatory Visit
Admission: RE | Admit: 2017-08-28 | Discharge: 2017-08-28 | Disposition: A | Discharge status: Home / Self Care | Payer: No Typology Code available for payment source | Source: Ambulatory Visit | Attending: Family Medicine | Admitting: Family Medicine

## 2017-08-28 DIAGNOSIS — Z1231 Encounter for screening mammogram for malignant neoplasm of breast: Secondary | ICD-10-CM

## 2017-09-22 ENCOUNTER — Other Ambulatory Visit: Payer: Self-pay | Admitting: Family Medicine

## 2017-12-04 ENCOUNTER — Telehealth: Payer: Self-pay | Admitting: Family Medicine

## 2017-12-04 DIAGNOSIS — E7800 Pure hypercholesterolemia, unspecified: Secondary | ICD-10-CM

## 2017-12-04 NOTE — Telephone Encounter (Signed)
-----   Message from Ellamae Sia sent at 11/21/2017 11:07 AM EDT ----- Regarding: Lab orders for Wednesday, 7.10.19 Patient is scheduled for CPX labs, please order future labs, Thanks , Karna Christmas

## 2017-12-05 ENCOUNTER — Other Ambulatory Visit (INDEPENDENT_AMBULATORY_CARE_PROVIDER_SITE_OTHER): Payer: Self-pay

## 2017-12-05 DIAGNOSIS — E7800 Pure hypercholesterolemia, unspecified: Secondary | ICD-10-CM

## 2017-12-05 LAB — COMPREHENSIVE METABOLIC PANEL WITH GFR
ALT: 18 U/L (ref 0–35)
AST: 19 U/L (ref 0–37)
Albumin: 3.9 g/dL (ref 3.5–5.2)
Alkaline Phosphatase: 55 U/L (ref 39–117)
BUN: 13 mg/dL (ref 6–23)
CO2: 29 meq/L (ref 19–32)
Calcium: 9.3 mg/dL (ref 8.4–10.5)
Chloride: 103 meq/L (ref 96–112)
Creatinine, Ser: 1.06 mg/dL (ref 0.40–1.20)
GFR: 57.58 mL/min — ABNORMAL LOW
Glucose, Bld: 96 mg/dL (ref 70–99)
Potassium: 4.3 meq/L (ref 3.5–5.1)
Sodium: 138 meq/L (ref 135–145)
Total Bilirubin: 0.5 mg/dL (ref 0.2–1.2)
Total Protein: 6.6 g/dL (ref 6.0–8.3)

## 2017-12-05 LAB — LIPID PANEL
Cholesterol: 218 mg/dL — ABNORMAL HIGH (ref 0–200)
HDL: 58.5 mg/dL
LDL Cholesterol: 136 mg/dL — ABNORMAL HIGH (ref 0–99)
NonHDL: 159.11
Total CHOL/HDL Ratio: 4
Triglycerides: 115 mg/dL (ref 0.0–149.0)
VLDL: 23 mg/dL (ref 0.0–40.0)

## 2017-12-11 ENCOUNTER — Encounter: Payer: Self-pay | Admitting: Family Medicine

## 2017-12-11 ENCOUNTER — Ambulatory Visit: Payer: Self-pay | Admitting: Family Medicine

## 2017-12-11 VITALS — BP 108/78 | HR 102 | Temp 98.5°F | Ht 65.25 in | Wt 199.5 lb

## 2017-12-11 DIAGNOSIS — F5104 Psychophysiologic insomnia: Secondary | ICD-10-CM

## 2017-12-11 DIAGNOSIS — E7800 Pure hypercholesterolemia, unspecified: Secondary | ICD-10-CM

## 2017-12-11 DIAGNOSIS — R740 Nonspecific elevation of levels of transaminase and lactic acid dehydrogenase [LDH]: Secondary | ICD-10-CM

## 2017-12-11 DIAGNOSIS — I10 Essential (primary) hypertension: Secondary | ICD-10-CM

## 2017-12-11 DIAGNOSIS — R7401 Elevation of levels of liver transaminase levels: Secondary | ICD-10-CM

## 2017-12-11 DIAGNOSIS — Z0000 Encounter for general adult medical examination without abnormal findings: Secondary | ICD-10-CM

## 2017-12-11 MED ORDER — LISINOPRIL 20 MG PO TABS: 20.0000 mg | ORAL_TABLET | Freq: Every day | ORAL | 3 refills | Status: DC

## 2017-12-11 MED ORDER — ALPRAZOLAM 0.5 MG PO TABS: 0.5000 mg | ORAL_TABLET | Freq: Every evening | ORAL | 0 refills | Status: DC | PRN

## 2017-12-11 MED ORDER — METAXALONE 800 MG PO TABS: 800.0000 mg | ORAL_TABLET | Freq: Three times a day (TID) | ORAL | 0 refills | Status: DC | PRN

## 2017-12-11 NOTE — Progress Notes (Signed)
Subjective:    Patient ID: Abigail Stout, female    DOB: 1966/01/27, 52 y.o.   MRN: [id number]  HPI  The patient is here for annual wellness exam and preventative care.    Hypertension: Good control on lisinopril. Using medication without problems or lightheadedness: none Chest pain with exertion:none Edema:none Short of breath:none Average home BPs: Other issues:  Elevated Cholesterol:  Improved cholesterol in last year.Marland Kitchen LDL 165 down to 136  She has been using metamucil. Lab Results  Component Value Date   CHOL 218 (H) 12/05/2017   HDL 58.50 12/05/2017   LDLCALC 136 (H) 12/05/2017   TRIG 115.0 12/05/2017   CHOLHDL 4 12/05/2017  Using medications without problems: Muscle aches:  Diet compliance: Good, using [employer] Exercise: [employer] twice a week Other complaints: Wt Readings from Last 3 Encounters:  12/11/17 199 lb 8 oz (90.5 kg)  01/05/17 191 lb 12 oz (87 kg)  12/31/15 193 lb 8 oz (87.8 kg)   Body mass index is 32.94 kg/m.    She is doing well with mood. Denies depression and anxiety. Chronic insomnia:  Using alprazolam as needed. Not regularly.'  Chronic low back pain: Using skelaxin as needed.  Social History /Family History/Past Medical History reviewed in detail and updated in EMR if needed. Blood pressure 108/78, pulse (!) 102, temperature 98.5 F (36.9 C), temperature source Oral, height 5' 5.25" (1.657 m), weight 199 lb 8 oz (90.5 kg), SpO2 98 %.  Review of Systems  Constitutional: Negative for fatigue and fever.  HENT: Negative for congestion.   Eyes: Negative for pain.  Respiratory: Negative for cough and shortness of breath.   Cardiovascular: Negative for chest pain, palpitations and leg swelling.  Gastrointestinal: Negative for abdominal pain.  Genitourinary: Negative for dysuria and vaginal bleeding.  Musculoskeletal: Negative for back pain.  Neurological: Negative for syncope, light-headedness and headaches.  Psychiatric/Behavioral:  Negative for dysphoric mood.       Objective:   Physical Exam  Constitutional: Vital signs are normal. She appears well-developed and well-nourished. She is cooperative.  Non-toxic appearance. She does not appear ill. No distress.  HENT:  Head: Normocephalic.  Right Ear: Hearing, tympanic membrane, external ear and ear canal normal.  Left Ear: Hearing, tympanic membrane, external ear and ear canal normal.  Nose: Nose normal.  Eyes: Pupils are equal, round, and reactive to light. Conjunctivae, EOM and lids are normal. Lids are everted and swept, no foreign bodies found.  Neck: Trachea normal and normal range of motion. Neck supple. Carotid bruit is not present. No thyroid mass and no thyromegaly present.  Cardiovascular: Normal rate, regular rhythm, S1 normal, S2 normal, normal heart sounds and intact distal pulses. Exam reveals no gallop.  No murmur heard. Pulmonary/Chest: Effort normal and breath sounds normal. No respiratory distress. She has no wheezes. She has no rhonchi. She has no rales.  Abdominal: Soft. Normal appearance and bowel sounds are normal. She exhibits no distension, no fluid wave, no abdominal bruit and no mass. There is no hepatosplenomegaly. There is no tenderness. There is no rebound, no guarding and no CVA tenderness. No hernia.  Lymphadenopathy:    She has no cervical adenopathy.    She has no axillary adenopathy.  Neurological: She is alert. She has normal strength. No cranial nerve deficit or sensory deficit.  Skin: Skin is warm, dry and intact. No rash noted.  Psychiatric: Her speech is normal and behavior is normal. Judgment normal. Her mood appears not anxious. Cognition and memory  are normal. She does not exhibit a depressed mood.          Assessment & Plan:  The patient's preventative maintenance and recommended screening tests for an annual wellness exam were reviewed in full today. Brought up to date unless services declined.  Counselled on the  importance of diet, exercise, and its role in overall health and mortality. The patient's FH and SH was reviewed, including their home life, tobacco status, and drug and alcohol status.   Vaccines:Tdap uptodate Colon: No early family history. Colonoscopy: 01/2015 DrMarland Kitchen Fuller Plan polyps: recommend repeat in 5 year PAP/DVE: 02/05/2017 nml, neg HPV, plan repeat, no family history ovarian uterine cancer. Mammo: 08/2017 STD screening: Refused.

## 2018-05-10 ENCOUNTER — Telehealth: Payer: Self-pay | Admitting: Family Medicine

## 2018-05-10 NOTE — Telephone Encounter (Signed)
Left message asking pt to call office due to schedule change please r/s 7/21 appointment with dr Diona Browner.  Can use later time that day or whatever day works best for pt

## 2018-06-05 ENCOUNTER — Telehealth: Payer: No Typology Code available for payment source | Admitting: Nurse Practitioner

## 2018-06-05 DIAGNOSIS — J069 Acute upper respiratory infection, unspecified: Secondary | ICD-10-CM

## 2018-06-05 MED ORDER — PREDNISONE 10 MG (21) PO TBPK: ORAL_TABLET | ORAL | 0 refills | Status: DC

## 2018-06-05 MED ORDER — AZITHROMYCIN 250 MG PO TABS: ORAL_TABLET | ORAL | 0 refills | Status: DC

## 2018-06-05 MED ORDER — BENZONATATE 100 MG PO CAPS: 100.0000 mg | ORAL_CAPSULE | Freq: Three times a day (TID) | ORAL | 0 refills | Status: DC | PRN

## 2018-06-05 NOTE — Progress Notes (Signed)
We are sorry that you are not feeling well.  Here is how we plan to help!  Based on your presentation I believe you most likely have A cough due to bacteria.  When patients have a fever and a productive cough with a change in color or increased sputum production, we are concerned about bacterial bronchitis.  If left untreated it can progress to pneumonia.  If your symptoms do not improve with your treatment plan it is important that you contact your provider.   I have prescribed Azithromyin 250 mg: two tablets now and then one tablet daily for 4 additonal days    In addition you may use A prescription cough medication called Tessalon Perles 100mg . You may take 1-2 capsules every 8 hours as needed for your cough.  Prednisone 10 mg daily for 6 days (see taper instructions below)  Directions for 6 day taper: Day 1: 2 tablets before breakfast, 1 after both lunch & dinner and 2 at bedtime Day 2: 1 tab before breakfast, 1 after both lunch & dinner and 2 at bedtime Day 3: 1 tab at each meal & 1 at bedtime Day 4: 1 tab at breakfast, 1 at lunch, 1 at bedtime Day 5: 1 tab at breakfast & 1 tab at bedtime Day 6: 1 tab at breakfast   From your responses in the eVisit questionnaire you describe inflammation in the upper respiratory tract which is causing a significant cough.  This is commonly called Bronchitis and has four common causes:    Allergies  Viral Infections  Acid Reflux  Bacterial Infection Allergies, viruses and acid reflux are treated by controlling symptoms or eliminating the cause. An example might be a cough caused by taking certain blood pressure medications. You stop the cough by changing the medication. Another example might be a cough caused by acid reflux. Controlling the reflux helps control the cough.  USE OF BRONCHODILATOR ("RESCUE") INHALERS: There is a risk from using your bronchodilator too frequently.  The risk is that over-reliance on a medication which only relaxes the  muscles surrounding the breathing tubes can reduce the effectiveness of medications prescribed to reduce swelling and congestion of the tubes themselves.  Although you feel brief relief from the bronchodilator inhaler, your asthma may actually be worsening with the tubes becoming more swollen and filled with mucus.  This can delay other crucial treatments, such as oral steroid medications. If you need to use a bronchodilator inhaler daily, several times per day, you should discuss this with your provider.  There are probably better treatments that could be used to keep your asthma under control.     HOME CARE . Only take medications as instructed by your medical team. . Complete the entire course of an antibiotic. . Drink plenty of fluids and get plenty of rest. . Avoid close contacts especially the very young and the elderly . Cover your mouth if you cough or cough into your sleeve. . Always remember to wash your hands . A steam or ultrasonic humidifier can help congestion.   GET HELP RIGHT AWAY IF: . You develop worsening fever. . You become short of breath . You cough up blood. . Your symptoms persist after you have completed your treatment plan MAKE SURE YOU   Understand these instructions.  Will watch your condition.  Will get help right away if you are not doing well or get worse.  Your e-visit answers were reviewed by a board certified advanced clinical practitioner to complete your  personal care plan.  Depending on the condition, your plan could have included both over the counter or prescription medications. If there is a problem please reply  once you have received a response from your provider. Your safety is important to [place].  If you have drug allergies check your prescription carefully.    You can use MyChart to ask questions about today's visit, request a non-urgent call back, or ask for a work or school excuse for 24 hours related to this e-Visit. If it has been greater than  24 hours you will need to follow up with your provider, or enter a new e-Visit to address those concerns. You will get an e-mail in the next two days asking about your experience.  I hope that your e-visit has been valuable and will speed your recovery. Thank you for using e-visits.

## 2018-07-16 NOTE — Telephone Encounter (Signed)
Left message asking pt to call office  °

## 2018-07-31 ENCOUNTER — Encounter: Payer: Self-pay | Admitting: Family Medicine

## 2018-12-09 ENCOUNTER — Telehealth: Payer: Self-pay

## 2018-12-09 NOTE — Telephone Encounter (Signed)
Left message to call clinic, needs COVID screen and back door lab info   

## 2018-12-11 ENCOUNTER — Other Ambulatory Visit: Payer: Self-pay

## 2018-12-11 ENCOUNTER — Other Ambulatory Visit (INDEPENDENT_AMBULATORY_CARE_PROVIDER_SITE_OTHER): Payer: Self-pay

## 2018-12-11 ENCOUNTER — Telehealth: Payer: Self-pay | Admitting: Family Medicine

## 2018-12-11 DIAGNOSIS — E7800 Pure hypercholesterolemia, unspecified: Secondary | ICD-10-CM

## 2018-12-11 LAB — LIPID PANEL
Cholesterol: 243 mg/dL — ABNORMAL HIGH (ref 0–200)
HDL: 71.4 mg/dL (ref >39.00)
LDL Cholesterol: 153 mg/dL — ABNORMAL HIGH (ref 0–99)
NonHDL: 171.13
Total CHOL/HDL Ratio: 3
Triglycerides: 93 mg/dL (ref 0.0–149.0)
VLDL: 18.6 mg/dL (ref 0.0–40.0)

## 2018-12-11 LAB — COMPREHENSIVE METABOLIC PANEL
ALT: 13 U/L (ref 0–35)
AST: 16 U/L (ref 0–37)
Albumin: 4.3 g/dL (ref 3.5–5.2)
Alkaline Phosphatase: 60 U/L (ref 39–117)
BUN: 13 mg/dL (ref 6–23)
CO2: 29 mEq/L (ref 19–32)
Calcium: 9.3 mg/dL (ref 8.4–10.5)
Chloride: 104 mEq/L (ref 96–112)
Creatinine, Ser: 1.01 mg/dL (ref 0.40–1.20)
GFR: 57.06 mL/min — ABNORMAL LOW (ref >60.00)
Glucose, Bld: 91 mg/dL (ref 70–99)
Potassium: 4.3 mEq/L (ref 3.5–5.1)
Sodium: 139 mEq/L (ref 135–145)
Total Bilirubin: 0.5 mg/dL (ref 0.2–1.2)
Total Protein: 6.5 g/dL (ref 6.0–8.3)

## 2018-12-11 NOTE — Telephone Encounter (Signed)
-----   Message from Ellamae Sia sent at 12/05/2018  7:43 AM EDT ----- Regarding: Lab orders for Wednesday, 7.15.20 Patient is scheduled for CPX labs, please order future labs, Thanks , Karna Christmas

## 2018-12-17 ENCOUNTER — Encounter: Payer: No Typology Code available for payment source | Admitting: Family Medicine

## 2018-12-20 ENCOUNTER — Encounter: Payer: Self-pay | Admitting: Family Medicine

## 2018-12-20 ENCOUNTER — Other Ambulatory Visit: Payer: Self-pay

## 2018-12-20 ENCOUNTER — Encounter: Payer: No Typology Code available for payment source | Admitting: Family Medicine

## 2018-12-20 ENCOUNTER — Ambulatory Visit: Payer: Self-pay | Admitting: Family Medicine

## 2018-12-20 VITALS — BP 110/80 | HR 79 | Temp 98.5°F | Ht 66.0 in | Wt 192.0 lb

## 2018-12-20 DIAGNOSIS — M7711 Lateral epicondylitis, right elbow: Secondary | ICD-10-CM

## 2018-12-20 DIAGNOSIS — I10 Essential (primary) hypertension: Secondary | ICD-10-CM

## 2018-12-20 DIAGNOSIS — Z0000 Encounter for general adult medical examination without abnormal findings: Secondary | ICD-10-CM

## 2018-12-20 DIAGNOSIS — M7710 Lateral epicondylitis, unspecified elbow: Secondary | ICD-10-CM | POA: Insufficient documentation

## 2018-12-20 DIAGNOSIS — E7800 Pure hypercholesterolemia, unspecified: Secondary | ICD-10-CM

## 2018-12-20 MED ORDER — TRIAMCINOLONE ACETONIDE 0.1 % EX CREA: 1.0000 "application " | TOPICAL_CREAM | Freq: Two times a day (BID) | CUTANEOUS | 0 refills | Status: DC

## 2018-12-20 NOTE — Patient Instructions (Addendum)
Work on [employer] and regular exercise. You can call for chol check  In 3-6 months.

## 2018-12-20 NOTE — Progress Notes (Signed)
Chief Complaint  Patient presents with  . Annual Exam    History of Present Illness: HPI   The patient's preventative maintenance and recommended screening tests for an annual wellness exam were reviewed in full today. Brought up to date unless services declined.  Counselled on the importance of diet, exercise, and its role in overall health and mortality. The patient's FH and SH was reviewed, including their home life, tobacco status, and drug and alcohol status.    Hypertension:   Good control on lisinopril BP Readings from Last 3 Encounters:  12/20/18 110/80  12/11/17 108/78  01/05/17 110/80  Using medication without problems or lightheadedness: none Chest pain with exertion:none Edema:none Short of breath:none Average home BPs: good  Other issues:  Chronic insomnia: good sleep. Rarely using the alprazolam.  Elevated Cholesterol:  Not at goal . 10 year CVD risk: 1.7% on red yeast rice. Lab Results  Component Value Date   CHOL 243 (H) 12/11/2018   HDL 71.40 12/11/2018   LDLCALC 153 (H) 12/11/2018   TRIG 93.0 12/11/2018   CHOLHDL 3 12/11/2018  Using medications without problems:none Muscle aches: none Diet compliance: healthy diet. Exercise: daily burn boot camp  Wt Readings from Last 3 Encounters:  12/20/18 192 lb (87.1 kg)  12/11/17 199 lb 8 oz (90.5 kg)  01/05/17 191 lb 12 oz (87 kg)    Other complaints: lateral elbow pain off and on    She is  triamcinolone cream for allergic reaction if skin off and on.. needs refill.  COVID 19 screen No recent travel or known exposure to COVID19 The patient denies respiratory symptoms of COVID 19 at this time.  The importance of social distancing was discussed today.   Review of Systems  Constitutional: Negative for chills and fever.  HENT: Negative for congestion and ear pain.   Eyes: Negative for pain and redness.  Respiratory: Negative for cough and shortness of breath.   Cardiovascular: Negative for chest  pain, palpitations and leg swelling.  Gastrointestinal: Negative for abdominal pain, blood in stool, constipation, diarrhea, nausea and vomiting.  Genitourinary: Negative for dysuria.  Musculoskeletal: Positive for joint pain. Negative for falls and myalgias.       Right lateral elbow pain off and on  Skin: Negative for rash.  Neurological: Negative for dizziness.  Psychiatric/Behavioral: Negative for depression. The patient is not nervous/anxious.       Past Medical History:  Diagnosis Date  . H/O hemorrhoids   . H/O menorrhagia   . H/O urinary incontinence   . History of urinary retention 04/29/09  . Irritable bowel syndrome (IBS)     reports that she has never smoked. She has never used smokeless tobacco. She reports that she does not drink alcohol or use drugs.   Current Outpatient Medications:  .  ALPRAZolam (XANAX) 0.5 MG tablet, Take 1 tablet (0.5 mg total) by mouth at bedtime as needed for anxiety or sleep., Disp: 30 tablet, Rfl: 0 .  lisinopril (PRINIVIL,ZESTRIL) 20 MG tablet, Take 1 tablet (20 mg total) by mouth daily., Disp: 90 tablet, Rfl: 3 .  metaxalone (SKELAXIN) 800 MG tablet, Take 1 tablet (800 mg total) by mouth 3 (three) times daily as needed for muscle spasms., Disp: 30 tablet, Rfl: 0   Observations/Objective: Blood pressure 110/80, pulse 79, temperature 98.5 F (36.9 C), temperature source Temporal, height 5\' 6"  (1.676 m), weight 192 lb (87.1 kg), SpO2 98 %.  Physical Exam Constitutional:      General: She is not in  acute distress.    Appearance: Normal appearance. She is well-developed. She is not ill-appearing or toxic-appearing.  HENT:     Head: Normocephalic.     Right Ear: Hearing, tympanic membrane, ear canal and external ear normal.     Left Ear: Hearing, tympanic membrane, ear canal and external ear normal.     Nose: Nose normal.  Eyes:     General: Lids are normal. Lids are everted, no foreign bodies appreciated.     Conjunctiva/sclera:  Conjunctivae normal.     Pupils: Pupils are equal, round, and reactive to light.  Neck:     Musculoskeletal: Normal range of motion and neck supple.     Thyroid: No thyroid mass or thyromegaly.     Vascular: No carotid bruit.     Trachea: Trachea normal.  Cardiovascular:     Rate and Rhythm: Normal rate and regular rhythm.     Heart sounds: Normal heart sounds, S1 normal and S2 normal. No murmur. No gallop.   Pulmonary:     Effort: Pulmonary effort is normal. No respiratory distress.     Breath sounds: Normal breath sounds. No wheezing, rhonchi or rales.  Abdominal:     General: Bowel sounds are normal. There is no distension or abdominal bruit.     Palpations: Abdomen is soft. There is no fluid wave or mass.     Tenderness: There is no abdominal tenderness. There is no guarding or rebound.     Hernia: No hernia is present.  Lymphadenopathy:     Cervical: No cervical adenopathy.  Skin:    General: Skin is warm and dry.     Findings: No rash.  Neurological:     Mental Status: She is alert.     Cranial Nerves: No cranial nerve deficit.     Sensory: No sensory deficit.  Psychiatric:        Mood and Affect: Mood is not anxious or depressed.        Speech: Speech normal.        Behavior: Behavior normal. Behavior is cooperative.        Judgment: Judgment normal.      Assessment and Plan   The patient's preventative maintenance and recommended screening tests for an annual wellness exam were reviewed in full today. Brought up to date unless services declined.  Counselled on the importance of diet, exercise, and its role in overall health and mortality. The patient's FH and SH was reviewed, including their home life, tobacco status, and drug and alcohol status.   Vaccines:Tdapuptodate Colon: No early family history. Colonoscopy: 01/2015 DrMarland Kitchen Fuller Plan polyps: recommend repeat in 5 year PAP/DVE: 02/05/2017 nml, neg HPV, plan repeat in 5 years, no family history ovarian uterine  cancer. Mammo: 08/2017, repeat q 2 years, no family history STD screening: Refused.  HTN (hypertension) Well controlled. Continue current medication. GFR < 60 likely due to ACEI.    HYPERCHOLESTEROLEMIA  Work on low chol diet keep up great work on exercise.  Lateral epicondylitis Treat with very short couse of NSAIDs.Delrae Rend, home PT.Call if not improving.    Eliezer Lofts, MD

## 2018-12-20 NOTE — Assessment & Plan Note (Signed)
Treat with very short couse of NSAIDs.Delrae Rend, home PT.Call if not improving.

## 2018-12-20 NOTE — Assessment & Plan Note (Signed)
Work on [employer] keep up great work on exercise.

## 2018-12-20 NOTE — Assessment & Plan Note (Signed)
Well controlled. Continue current medication. GFR < 60 likely due to ACEI.

## 2019-01-18 ENCOUNTER — Other Ambulatory Visit: Payer: Self-pay | Admitting: Family Medicine

## 2019-08-01 ENCOUNTER — Other Ambulatory Visit: Payer: Self-pay | Admitting: Family Medicine

## 2019-10-22 ENCOUNTER — Other Ambulatory Visit: Payer: Self-pay | Admitting: Family Medicine

## 2019-12-19 ENCOUNTER — Other Ambulatory Visit: Payer: Self-pay

## 2019-12-26 ENCOUNTER — Encounter: Payer: Self-pay | Admitting: Family Medicine

## 2020-01-02 ENCOUNTER — Telehealth: Payer: Self-pay | Admitting: Family Medicine

## 2020-01-02 DIAGNOSIS — Z1159 Encounter for screening for other viral diseases: Secondary | ICD-10-CM

## 2020-01-02 DIAGNOSIS — E7800 Pure hypercholesterolemia, unspecified: Secondary | ICD-10-CM

## 2020-01-02 NOTE — Telephone Encounter (Signed)
-----   Message from Ellamae Sia sent at 12/24/2019 11:32 AM EDT ----- Regarding: Lab orders for Monday, 8.9.21 Patient is scheduled for CPX labs, please order future labs, Thanks , Karna Christmas

## 2020-01-05 ENCOUNTER — Other Ambulatory Visit (INDEPENDENT_AMBULATORY_CARE_PROVIDER_SITE_OTHER): Payer: Self-pay

## 2020-01-05 ENCOUNTER — Other Ambulatory Visit: Payer: Self-pay

## 2020-01-05 DIAGNOSIS — Z1159 Encounter for screening for other viral diseases: Secondary | ICD-10-CM

## 2020-01-05 DIAGNOSIS — E7800 Pure hypercholesterolemia, unspecified: Secondary | ICD-10-CM

## 2020-01-05 LAB — LIPID PANEL
Cholesterol: 216 mg/dL — ABNORMAL HIGH (ref 0–200)
HDL: 58.5 mg/dL (ref >39.00)
LDL Cholesterol: 133 mg/dL — ABNORMAL HIGH (ref 0–99)
NonHDL: 157.81
Total CHOL/HDL Ratio: 4
Triglycerides: 124 mg/dL (ref 0.0–149.0)
VLDL: 24.8 mg/dL (ref 0.0–40.0)

## 2020-01-05 LAB — COMPREHENSIVE METABOLIC PANEL
ALT: 16 U/L (ref 0–35)
AST: 18 U/L (ref 0–37)
Albumin: 4 g/dL (ref 3.5–5.2)
Alkaline Phosphatase: 48 U/L (ref 39–117)
BUN: 15 mg/dL (ref 6–23)
CO2: 25 mEq/L (ref 19–32)
Calcium: 9.6 mg/dL (ref 8.4–10.5)
Chloride: 103 mEq/L (ref 96–112)
Creatinine, Ser: 1.1 mg/dL (ref 0.40–1.20)
GFR: 51.5 mL/min — ABNORMAL LOW (ref >60.00)
Glucose, Bld: 94 mg/dL (ref 70–99)
Potassium: 3.9 mEq/L (ref 3.5–5.1)
Sodium: 137 mEq/L (ref 135–145)
Total Bilirubin: 0.4 mg/dL (ref 0.2–1.2)
Total Protein: 6.6 g/dL (ref 6.0–8.3)

## 2020-01-05 NOTE — Progress Notes (Signed)
No critical labs need to be addressed urgently. We will discuss labs in detail at upcoming office visit.   

## 2020-01-06 LAB — HEPATITIS C ANTIBODY
Hepatitis C Ab: NONREACTIVE
SIGNAL TO CUT-OFF: 0.01 (ref <1.00)

## 2020-01-09 ENCOUNTER — Other Ambulatory Visit: Payer: Self-pay

## 2020-01-09 ENCOUNTER — Ambulatory Visit: Payer: Self-pay | Admitting: Family Medicine

## 2020-01-09 VITALS — BP 110/72 | HR 83 | Temp 98.2°F | Ht 65.0 in | Wt 184.0 lb

## 2020-01-09 DIAGNOSIS — E7800 Pure hypercholesterolemia, unspecified: Secondary | ICD-10-CM

## 2020-01-09 DIAGNOSIS — F5104 Psychophysiologic insomnia: Secondary | ICD-10-CM

## 2020-01-09 DIAGNOSIS — I10 Essential (primary) hypertension: Secondary | ICD-10-CM

## 2020-01-09 DIAGNOSIS — Z0000 Encounter for general adult medical examination without abnormal findings: Secondary | ICD-10-CM

## 2020-01-09 DIAGNOSIS — R944 Abnormal results of kidney function studies: Secondary | ICD-10-CM

## 2020-01-09 MED ORDER — ALPRAZOLAM 0.5 MG PO TABS: 0.5000 mg | ORAL_TABLET | Freq: Every evening | ORAL | 0 refills | Status: DC | PRN

## 2020-01-09 NOTE — Progress Notes (Signed)
Chief Complaint  Patient presents with  . Annual Exam    History of Present Illness: HPI  The patient is here for annual wellness exam and preventative care.     Nodules and soreness in right DIP joint in last 2 months.  Hypertension:   At goal on lisinopril.  BP Readings from Last 3 Encounters:  01/09/20 110/72  12/20/18 110/80  12/11/17 108/78  Using medication without problems or lightheadedness: none Chest pain with exertion:none Edema:none Short of breath:none Average home BPs: Other issues:  Elevated Cholesterol:  Risk of CVD tolerable. Encouraged exercise, weight loss, healthy eating habits.  Lab Results  Component Value Date   CHOL 216 (H) 01/05/2020   HDL 58.50 01/05/2020   LDLCALC 133 (H) 01/05/2020   TRIG 124.0 01/05/2020   CHOLHDL 4 01/05/2020  The 10-year ASCVD risk score Mikey Bussing DC Jr., et al., 2013) is: 2%   Values used to calculate the score:     Age: 54 years     Sex: Female     Is Non-Hispanic African American: No     Diabetic: No     Tobacco smoker: No     Systolic Blood Pressure: 275 mmHg     Is BP treated: Yes     HDL Cholesterol: 58.5 mg/dL     Total Cholesterol: 216 mg/dL Using medications without problems: Muscle aches:  Diet compliance: eating more oatmeal Exercise: 2 times daily.with walking boot camp. Other complaints:  She has lost 15 lbs in last 2 years. Wt Readings from Last 3 Encounters:  01/09/20 184 lb (83.5 kg)  12/20/18 192 lb (87.1 kg)  12/11/17 199 lb 8 oz (90.5 kg)    Chronic insomnia Sleeping well overall. Alprazolam prn..uses rarely. PDMP reviewed.   This visit occurred during the SARS-CoV-2 public health emergency.  Safety protocols were in place, including screening questions prior to the visit, additional usage of staff PPE, and extensive cleaning of exam room while observing appropriate contact time as indicated for disinfecting solutions.   COVID 19 screen:  No recent travel or known exposure to COVID19 The  patient denies respiratory symptoms of COVID 19 at this time. The importance of social distancing was discussed today.     ROS    Past Medical History:  Diagnosis Date  . H/O hemorrhoids   . H/O menorrhagia   . H/O urinary incontinence   . History of urinary retention 04/29/09  . Irritable bowel syndrome (IBS)     reports that she has never smoked. She has never used smokeless tobacco. She reports that she does not drink alcohol and does not use drugs.   Current Outpatient Medications:  .  ALPRAZolam (XANAX) 0.5 MG tablet, Take 1 tablet (0.5 mg total) by mouth at bedtime as needed for anxiety or sleep., Disp: 30 tablet, Rfl: 0 .  lisinopril (ZESTRIL) 20 MG tablet, Take 1 tablet by mouth once daily, Disp: 90 tablet, Rfl: 0 .  triamcinolone cream (KENALOG) 0.1 %, Apply 1 application topically 2 (two) times daily., Disp: 30 g, Rfl: 0   Observations/Objective: Blood pressure 110/72, pulse 83, temperature 98.2 F (36.8 C), temperature source Temporal, height 5\' 5"  (1.651 m), weight 184 lb (83.5 kg), SpO2 98 %.  Physical Exam Constitutional:      General: She is not in acute distress.    Appearance: Normal appearance. She is well-developed. She is not ill-appearing or toxic-appearing.  HENT:     Head: Normocephalic.     Right Ear: Hearing, tympanic membrane,  ear canal and external ear normal.     Left Ear: Hearing, tympanic membrane, ear canal and external ear normal.     Nose: Nose normal.  Eyes:     General: Lids are normal. Lids are everted, no foreign bodies appreciated.     Conjunctiva/sclera: Conjunctivae normal.     Pupils: Pupils are equal, round, and reactive to light.  Neck:     Thyroid: No thyroid mass or thyromegaly.     Vascular: No carotid bruit.     Trachea: Trachea normal.  Cardiovascular:     Rate and Rhythm: Normal rate and regular rhythm.     Heart sounds: Normal heart sounds, S1 normal and S2 normal. No murmur heard.  No gallop.   Pulmonary:     Effort:  Pulmonary effort is normal. No respiratory distress.     Breath sounds: Normal breath sounds. No wheezing, rhonchi or rales.  Abdominal:     General: Bowel sounds are normal. There is no distension or abdominal bruit.     Palpations: Abdomen is soft. There is no fluid wave or mass.     Tenderness: There is no abdominal tenderness. There is no guarding or rebound.     Hernia: No hernia is present.  Musculoskeletal:     Cervical back: Normal range of motion and neck supple.  Lymphadenopathy:     Cervical: No cervical adenopathy.  Skin:    General: Skin is warm and dry.     Findings: No rash.     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 Also several warts on fingers  Neurological:     Mental Status: She is alert.     Cranial Nerves: No cranial nerve deficit.     Sensory: No sensory deficit.  Psychiatric:        Mood and Affect: Mood is not anxious or depressed.        Speech: Speech normal.        Behavior: Behavior normal. Behavior is cooperative.        Judgment: Judgment normal.      Assessment and Plan The patient's preventative maintenance and recommended screening tests for an annual wellness exam were reviewed in full today. Brought up to date unless services declined.  Counselled on the importance of diet, exercise, and its role in overall health and mortality. The patient's FH and SH was reviewed, including their home life, tobacco status, and drug and alcohol status.   Vaccines:Tdap, COVID19uptodate Colon: No early family history. Colonoscopy: 01/2015 DrMarland Kitchen Fuller Plan polyps: recommend repeat in 5 year.. due later this year PAP/DVE:02/05/2017 nml, neg HPV, plan repeat in 5 years, no family history ovarian uterine cancer. Mammo:08/2017, repeat q 1-2 years, no family history DUE STD screening: Refused.  No smoker  No ETOH use.   Chronic insomnia Use 1/2 tab of xanax at bedtime for sleep.. Once every 2 mnths. Trys to limit use.  Working on  healthy sleep hygeine instead, still likes to have Rx on hand.   Decreased GFR Eval with urine microalbumin.  HYPERCHOLESTEROLEMIA Encouraged exercise, weight loss, healthy eating habits.   HTN (hypertension) Well controlled. Continue current medication.     Eliezer Lofts, MD

## 2020-01-09 NOTE — Assessment & Plan Note (Signed)
Eval with urine microalbumin.

## 2020-01-09 NOTE — Patient Instructions (Addendum)
Stop at the lab on the way out for urine micro albumin test.  Please call the location of your choice from the menu below to schedule your Mammogram and/or Bone Density appointment.    [place]   1. [facility]                      Phone:  [phone] [address], [place] [address]                                                             Services: Traditional and 3D Mammogram, Bone Density   2. [place] [facility]                 Phone: [phone] [address], [place] [address]    Service: Bone Density ONLY   *this site does NOT perform mammograms  3. [place]                        Phone:  [phone], [place] [address]                                            Services:  3D Mammogram and Bone Density    [place]  1. [place] [facility]   Phone:  [phone]   [place], [place] [address]                                            Services: 3D Mammogram and Bone Density  2. [place] at [facility])  Phone:  [phone]   [address]  [place], [place] [address]                                              Services:  3D Mammogram and Bone Density

## 2020-01-09 NOTE — Assessment & Plan Note (Signed)
Well controlled. Continue current medication.  

## 2020-01-09 NOTE — Assessment & Plan Note (Signed)
Encouraged exercise, weight loss, healthy eating habits. ? ?

## 2020-01-09 NOTE — Assessment & Plan Note (Signed)
Use 1/2 tab of xanax at bedtime for sleep.. Once every 2 mnths. Trys to limit use.  Working on healthy sleep hygeine instead, still likes to have Rx on hand.

## 2020-01-10 LAB — MICROALBUMIN / CREATININE URINE RATIO
Creatinine, Urine: 53 mg/dL (ref 20–275)
Microalb Creat Ratio: 4 mcg/mg creat (ref <30)
Microalb, Ur: 0.2 mg/dL

## 2020-02-12 ENCOUNTER — Other Ambulatory Visit: Payer: Self-pay | Admitting: Family Medicine

## 2020-04-06 ENCOUNTER — Telehealth: Payer: Self-pay | Admitting: Family Medicine

## 2020-04-06 NOTE — Telephone Encounter (Signed)
Pt came into office with bill stating that she was billed over 200 for a UA. Pt stated she previously discussed with PCP the tests were needing to be limited, due to being self pay. Stated pcp expressed understanding but was still charged for other things. Copy of bill made and given to manager, Raquel Sarna to look into. Please advise.

## 2020-04-15 ENCOUNTER — Ambulatory Visit: Payer: Self-pay | Attending: Internal Medicine

## 2020-04-15 DIAGNOSIS — Z23 Encounter for immunization: Secondary | ICD-10-CM

## 2020-04-15 NOTE — Progress Notes (Signed)
   Covid-19 Vaccination Clinic  Name:  Abigail Stout    MRN: [id number] DOB: 1966/02/22  04/15/2020  [name] was observed post Covid-19 immunization for 15 minutes without incident. She was provided with Vaccine Information Sheet and instruction to access the V-Safe system.   [name] was instructed to call 911 with any severe reactions post vaccine: Marland Kitchen Difficulty breathing  . Swelling of face and throat  . A fast heartbeat  . A bad rash all over body  . Dizziness and weakness   Immunizations Administered    Name Date Dose VIS Date Route   JANSSEN COVID-19 VACCINE 04/15/2020  4:11 PM 0.5 mL 03/17/2020 Intramuscular   Manufacturer: [employer]   Lot: [id number]   [place]: [id number]

## 2020-07-07 ENCOUNTER — Encounter: Payer: Self-pay | Admitting: Gastroenterology

## 2020-08-30 ENCOUNTER — Other Ambulatory Visit: Payer: Self-pay | Admitting: Family Medicine

## 2020-10-21 ENCOUNTER — Other Ambulatory Visit: Payer: Self-pay | Admitting: Family Medicine

## 2020-10-21 DIAGNOSIS — Z1231 Encounter for screening mammogram for malignant neoplasm of breast: Secondary | ICD-10-CM

## 2020-10-26 ENCOUNTER — Ambulatory Visit
Admission: RE | Admit: 2020-10-26 | Discharge: 2020-10-26 | Disposition: A | Discharge status: Home / Self Care | Payer: Self-pay | Source: Ambulatory Visit | Attending: Family Medicine | Admitting: Family Medicine

## 2020-10-26 ENCOUNTER — Other Ambulatory Visit: Payer: Self-pay

## 2020-10-26 DIAGNOSIS — Z1231 Encounter for screening mammogram for malignant neoplasm of breast: Secondary | ICD-10-CM

## 2020-12-06 ENCOUNTER — Other Ambulatory Visit: Payer: Self-pay | Admitting: Family Medicine

## 2020-12-07 NOTE — Telephone Encounter (Signed)
Please schedule CPE with fasting labs prior with Dr. Diona Browner for sometime after 01/08/2021.

## 2020-12-07 NOTE — Telephone Encounter (Signed)
Left voice message to call the office  

## 2021-03-15 ENCOUNTER — Telehealth: Payer: Self-pay | Admitting: Family Medicine

## 2021-03-15 MED ORDER — LISINOPRIL 20 MG PO TABS: 20.0000 mg | ORAL_TABLET | Freq: Every day | ORAL | 0 refills | Status: DC

## 2021-03-15 NOTE — Telephone Encounter (Signed)
  Encourage patient to contact the pharmacy for refills or they can request refills through [facility]:  Please schedule appointment if longer than 1 year  NEXT APPOINTMENT DATE:  MEDICATION:lisinopril  Is the patient out of medication? yes  PHARMACY: [facility] on [address] in [place]  Let patient know to contact pharmacy at the end of the day to make sure medication is ready.  Please notify patient to allow 48-72 hours to process  CLINICAL FILLS OUT ALL BELOW:   LAST REFILL:  QTY:  REFILL DATE:    OTHER COMMENTS:    Okay for refill?  Please advise

## 2021-03-15 NOTE — Telephone Encounter (Signed)
Refill sent as requested. 

## 2021-05-25 ENCOUNTER — Telehealth: Payer: Self-pay | Admitting: Family Medicine

## 2021-05-25 DIAGNOSIS — E7800 Pure hypercholesterolemia, unspecified: Secondary | ICD-10-CM

## 2021-05-25 NOTE — Telephone Encounter (Signed)
-----   Message from Ellamae Sia sent at 05/17/2021  7:57 AM EST ----- Regarding: Lab orders for Friday, 1.6.23 Patient is scheduled for CPX labs, please order future labs, Thanks , Karna Christmas

## 2021-06-03 ENCOUNTER — Other Ambulatory Visit: Payer: Self-pay

## 2021-06-03 ENCOUNTER — Other Ambulatory Visit (INDEPENDENT_AMBULATORY_CARE_PROVIDER_SITE_OTHER): Payer: Self-pay

## 2021-06-03 DIAGNOSIS — E7800 Pure hypercholesterolemia, unspecified: Secondary | ICD-10-CM

## 2021-06-03 LAB — COMPREHENSIVE METABOLIC PANEL
ALT: 25 U/L (ref 0–35)
AST: 21 U/L (ref 0–37)
Albumin: 4.2 g/dL (ref 3.5–5.2)
Alkaline Phosphatase: 64 U/L (ref 39–117)
BUN: 13 mg/dL (ref 6–23)
CO2: 29 mEq/L (ref 19–32)
Calcium: 9.2 mg/dL (ref 8.4–10.5)
Chloride: 106 mEq/L (ref 96–112)
Creatinine, Ser: 1.11 mg/dL (ref 0.40–1.20)
GFR: 55.47 mL/min — ABNORMAL LOW (ref >60.00)
Glucose, Bld: 86 mg/dL (ref 70–99)
Potassium: 4 mEq/L (ref 3.5–5.1)
Sodium: 140 mEq/L (ref 135–145)
Total Bilirubin: 0.6 mg/dL (ref 0.2–1.2)
Total Protein: 6.6 g/dL (ref 6.0–8.3)

## 2021-06-03 LAB — LIPID PANEL
Cholesterol: 234 mg/dL — ABNORMAL HIGH (ref 0–200)
HDL: 69.6 mg/dL (ref >39.00)
LDL Cholesterol: 148 mg/dL — ABNORMAL HIGH (ref 0–99)
NonHDL: 163.97
Total CHOL/HDL Ratio: 3
Triglycerides: 78 mg/dL (ref 0.0–149.0)
VLDL: 15.6 mg/dL (ref 0.0–40.0)

## 2021-06-03 NOTE — Progress Notes (Signed)
No critical labs need to be addressed urgently. We will discuss labs in detail at upcoming office visit.   

## 2021-06-10 ENCOUNTER — Encounter: Payer: No Typology Code available for payment source | Admitting: Family Medicine

## 2021-06-16 ENCOUNTER — Other Ambulatory Visit: Payer: Self-pay

## 2021-06-16 ENCOUNTER — Ambulatory Visit (INDEPENDENT_AMBULATORY_CARE_PROVIDER_SITE_OTHER): Payer: Self-pay | Admitting: Family Medicine

## 2021-06-16 ENCOUNTER — Encounter: Payer: Self-pay | Admitting: Family Medicine

## 2021-06-16 VITALS — BP 120/90 | HR 83 | Temp 97.4°F | Ht 65.0 in | Wt 192.0 lb

## 2021-06-16 DIAGNOSIS — I10 Essential (primary) hypertension: Secondary | ICD-10-CM

## 2021-06-16 DIAGNOSIS — Z1211 Encounter for screening for malignant neoplasm of colon: Secondary | ICD-10-CM

## 2021-06-16 DIAGNOSIS — E7800 Pure hypercholesterolemia, unspecified: Secondary | ICD-10-CM

## 2021-06-16 DIAGNOSIS — J683 Other acute and subacute respiratory conditions due to chemicals, gases, fumes and vapors: Secondary | ICD-10-CM | POA: Insufficient documentation

## 2021-06-16 DIAGNOSIS — R944 Abnormal results of kidney function studies: Secondary | ICD-10-CM

## 2021-06-16 DIAGNOSIS — Z0000 Encounter for general adult medical examination without abnormal findings: Secondary | ICD-10-CM

## 2021-06-16 MED ORDER — ALBUTEROL SULFATE HFA 108 (90 BASE) MCG/ACT IN AERS: 2.0000 | INHALATION_SPRAY | Freq: Four times a day (QID) | RESPIRATORY_TRACT | 2 refills | Status: DC | PRN

## 2021-06-16 MED ORDER — LISINOPRIL 20 MG PO TABS: 20.0000 mg | ORAL_TABLET | Freq: Every day | ORAL | 3 refills | Status: DC

## 2021-06-16 NOTE — Assessment & Plan Note (Signed)
Stable, chronic.  Continue current medication.    On lisinopril[ril 20 mg daily

## 2021-06-16 NOTE — Patient Instructions (Addendum)
Referral placed for Colonoscopy.  Can use albuterol as needed for reactive airway's when you have a respiratory illness.

## 2021-06-16 NOTE — Progress Notes (Signed)
Patient ID: Abigail Stout, female    DOB: Mar 18, 1966, 56 y.o.   MRN: [id number]  This visit was conducted in person.  BP 120/90    Pulse 83    Temp (!) 97.4 F (36.3 C) (Temporal)    Ht 5\' 5"  (1.651 m)    Wt 192 lb (87.1 kg)    SpO2 97%    BMI 31.95 kg/m    CC:  Chief Complaint  Patient presents with   Annual Exam    Subjective:   HPI: Abigail Stout is a 56 y.o. female presenting on 06/16/2021 for Annual Exam  Hypertension:    At goal on lisinopril 20 mg daily BP Readings from Last 3 Encounters:  06/16/21 120/90  01/09/20 110/72  12/20/18 110/80  Using medication without problems or lightheadedness:  none Chest pain with exertion: none Edema:none Short of breath:none Average home BPs: not checking Other issues:  Elevated Cholesterol:  Using red yest rice BID, oatmeal daily, healthy eating Lab Results  Component Value Date   CHOL 234 (H) 06/03/2021   HDL 69.60 06/03/2021   LDLCALC 148 (H) 06/03/2021   TRIG 78.0 06/03/2021   CHOLHDL 3 06/03/2021  The 10-year ASCVD risk score (Arnett DK, et al., 2019) is: 2.5%   Values used to calculate the score:     Age: 37 years     Sex: Female     Is Non-Hispanic African American: No     Diabetic: No     Tobacco smoker: No     Systolic Blood Pressure: 841 mmHg     Is BP treated: Yes     HDL Cholesterol: 69.6 mg/dL     Total Cholesterol: 234 mg/dL  Using medications without problems: Muscle aches:  Diet compliance:good Exercise: walking daily Other complaints:  Wt Readings from Last 3 Encounters:  06/16/21 192 lb (87.1 kg)  01/09/20 184 lb (83.5 kg)  12/20/18 192 lb (87.1 kg)     She tends to have wheeze with respiratory infection... requests albuterol inhaler.     Relevant past medical, surgical, family and social history reviewed and updated as indicated. Interim medical history since our last visit reviewed. Allergies and medications reviewed and updated. Outpatient Medications Prior to Visit   Medication Sig Dispense Refill   ALPRAZolam (XANAX) 0.5 MG tablet Take 1 tablet (0.5 mg total) by mouth at bedtime as needed for anxiety or sleep. 30 tablet 0   lisinopril (ZESTRIL) 20 MG tablet Take 1 tablet (20 mg total) by mouth daily. 90 tablet 0   triamcinolone cream (KENALOG) 0.1 % Apply 1 application topically 2 (two) times daily as needed.     triamcinolone cream (KENALOG) 0.1 % Apply 1 application topically 2 (two) times daily. (Patient taking differently: Apply 1 application topically as needed. ) 30 g 0   No facility-administered medications prior to visit.     Per HPI unless specifically indicated in ROS section below Review of Systems  Constitutional:  Negative for fatigue and fever.  HENT:  Negative for congestion.   Eyes:  Negative for pain.  Respiratory:  Negative for cough and shortness of breath.   Cardiovascular:  Negative for chest pain, palpitations and leg swelling.  Gastrointestinal:  Negative for abdominal pain.  Genitourinary:  Negative for dysuria and vaginal bleeding.  Musculoskeletal:  Negative for back pain.  Neurological:  Negative for syncope, light-headedness and headaches.  Psychiatric/Behavioral:  Negative for dysphoric mood.   Objective:  BP 120/90    Pulse 83  Temp (!) 97.4 F (36.3 C) (Temporal)    Ht 5\' 5"  (1.651 m)    Wt 192 lb (87.1 kg)    SpO2 97%    BMI 31.95 kg/m   Wt Readings from Last 3 Encounters:  06/16/21 192 lb (87.1 kg)  01/09/20 184 lb (83.5 kg)  12/20/18 192 lb (87.1 kg)      Physical Exam Vitals and nursing note reviewed.  Constitutional:      General: She is not in acute distress.    Appearance: Normal appearance. She is well-developed. She is not ill-appearing or toxic-appearing.  HENT:     Head: Normocephalic.     Right Ear: Hearing, tympanic membrane, ear canal and external ear normal.     Left Ear: Hearing, tympanic membrane, ear canal and external ear normal.     Nose: Nose normal.  Eyes:     General: Lids are  normal. Lids are everted, no foreign bodies appreciated.     Conjunctiva/sclera: Conjunctivae normal.     Pupils: Pupils are equal, round, and reactive to light.  Neck:     Thyroid: No thyroid mass or thyromegaly.     Vascular: No carotid bruit.     Trachea: Trachea normal.  Cardiovascular:     Rate and Rhythm: Normal rate and regular rhythm.     Heart sounds: Normal heart sounds, S1 normal and S2 normal. No murmur heard.   No gallop.  Pulmonary:     Effort: Pulmonary effort is normal. No respiratory distress.     Breath sounds: Normal breath sounds. No wheezing, rhonchi or rales.  Abdominal:     General: Bowel sounds are normal. There is no distension or abdominal bruit.     Palpations: Abdomen is soft. There is no fluid wave or mass.     Tenderness: There is no abdominal tenderness. There is no guarding or rebound.     Hernia: No hernia is present.  Musculoskeletal:     Cervical back: Normal range of motion and neck supple.  Lymphadenopathy:     Cervical: No cervical adenopathy.  Skin:    General: Skin is warm and dry.     Findings: No rash.  Neurological:     Mental Status: She is alert.     Cranial Nerves: No cranial nerve deficit.     Sensory: No sensory deficit.  Psychiatric:        Mood and Affect: Mood is not anxious or depressed.        Speech: Speech normal.        Behavior: Behavior normal. Behavior is cooperative.        Judgment: Judgment normal.      Results for orders placed or performed in visit on 06/03/21  Comprehensive metabolic panel  Result Value Ref Range   Sodium 140 135 - 145 mEq/L   Potassium 4.0 3.5 - 5.1 mEq/L   Chloride 106 96 - 112 mEq/L   CO2 29 19 - 32 mEq/L   Glucose, Bld 86 70 - 99 mg/dL   BUN 13 6 - 23 mg/dL   Creatinine, Ser 1.11 0.40 - 1.20 mg/dL   Total Bilirubin 0.6 0.2 - 1.2 mg/dL   Alkaline Phosphatase 64 39 - 117 U/L   AST 21 0 - 37 U/L   ALT 25 0 - 35 U/L   Total Protein 6.6 6.0 - 8.3 g/dL   Albumin 4.2 3.5 - 5.2 g/dL    GFR 55.47 (L) >60.00 mL/min   Calcium 9.2  8.4 - 10.5 mg/dL  Lipid panel  Result Value Ref Range   Cholesterol 234 (H) 0 - 200 mg/dL   Triglycerides 78.0 0.0 - 149.0 mg/dL   HDL 69.60 >39.00 mg/dL   VLDL 15.6 0.0 - 40.0 mg/dL   LDL Cholesterol 148 (H) 0 - 99 mg/dL   Total CHOL/HDL Ratio 3    NonHDL 163.97     This visit occurred during the SARS-CoV-2 public health emergency.  Safety protocols were in place, including screening questions prior to the visit, additional usage of staff PPE, and extensive cleaning of exam room while observing appropriate contact time as indicated for disinfecting solutions.   COVID 19 screen:  No recent travel or known exposure to COVID19 The patient denies respiratory symptoms of COVID 19 at this time. The importance of social distancing was discussed today.   Assessment and Plan The patient's preventative maintenance and recommended screening tests for an annual wellness exam were reviewed in full today. Brought up to date unless services declined.  Counselled on the importance of diet, exercise, and its role in overall health and mortality. The patient's FH and SH was reviewed, including their home life, tobacco status, and drug and alcohol status.   Vaccines:Tdap, COVID19 uptodate Consider shingrix, refused flu Colon: No early family  history.  Colonoscopy:   01/2015 DrMarland Kitchen [place] Plan polyps: recommend repeat in 5 year .. due   PAP/DVE: 02/05/2017 nml, neg HPV, plan repeat in 5 years, no family history ovarian uterine cancer.  Mammo: 5/22 repeat q 1-2 years, no family history   STD screening: Refused.  No smoker  No ETOH use.     Problem List Items Addressed This Visit     Decreased GFR    Stable/improved. Good water intake. Neg microalbumin.  Likely due to ACEI.      Essential hypertension    Stable, chronic.  Continue current medication.    On lisinopril[ril 20 mg daily      Relevant Medications   lisinopril (ZESTRIL) 20 MG tablet    HYPERCHOLESTEROLEMIA    The 10-year ASCVD risk score (Arnett DK, et al., 2019) is: 2.5%   Values used to calculate the score:     Age: 2 years     Sex: Female     Is Non-Hispanic African American: No     Diabetic: No     Tobacco smoker: No     Systolic Blood Pressure: 578 mmHg     Is BP treated: Yes     HDL Cholesterol: 69.6 mg/dL     Total Cholesterol: 234 mg/dL  Encouraged exercise, weight loss, healthy eating habits.       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      Other Visit Diagnoses     Routine general medical examination at a health care facility    -  Primary   Colon cancer screening       Relevant Orders   Ambulatory referral to Gastroenterology      Meds ordered this encounter  Medications   albuterol (VENTOLIN HFA) 108 (90 Base) MCG/ACT inhaler    Sig: Inhale 2 puffs into the lungs every 6 (six) hours as needed for wheezing or shortness of breath.    Dispense:  8 g    Refill:  2   lisinopril (ZESTRIL) 20 MG tablet    Sig: Take 1 tablet (20 mg total)  by mouth daily.    Dispense:  90 tablet    Refill:  3     Eliezer Lofts, MD

## 2021-06-16 NOTE — Assessment & Plan Note (Signed)
Stable/improved. Good water intake. Neg microalbumin.  Likely due to ACEI.

## 2021-06-16 NOTE — Assessment & Plan Note (Signed)
Use albuterol prn.

## 2021-06-16 NOTE — Assessment & Plan Note (Signed)
The 10-year ASCVD risk score (Arnett DK, et al., 2019) is: 2.5%   Values used to calculate the score:     Age: 57 years     Sex: Female     Is Non-Hispanic African American: No     Diabetic: No     Tobacco smoker: No     Systolic Blood Pressure: 750 mmHg     Is BP treated: Yes     HDL Cholesterol: 69.6 mg/dL     Total Cholesterol: 234 mg/dL  Encouraged exercise, weight loss, healthy eating habits.

## 2021-07-06 ENCOUNTER — Encounter: Payer: Self-pay | Admitting: *Deleted

## 2022-04-17 ENCOUNTER — Other Ambulatory Visit: Payer: Self-pay | Admitting: Family Medicine

## 2022-04-17 ENCOUNTER — Encounter: Payer: Self-pay | Admitting: Family Medicine

## 2022-04-17 DIAGNOSIS — Z1231 Encounter for screening mammogram for malignant neoplasm of breast: Secondary | ICD-10-CM

## 2022-06-09 ENCOUNTER — Ambulatory Visit
Admission: RE | Admit: 2022-06-09 | Discharge: 2022-06-09 | Disposition: A | Discharge status: Home / Self Care | Payer: No Typology Code available for payment source | Source: Ambulatory Visit | Attending: Family Medicine | Admitting: Family Medicine

## 2022-06-09 DIAGNOSIS — Z1231 Encounter for screening mammogram for malignant neoplasm of breast: Secondary | ICD-10-CM

## 2022-07-10 ENCOUNTER — Other Ambulatory Visit: Payer: Self-pay | Admitting: Family Medicine

## 2022-07-10 NOTE — Telephone Encounter (Signed)
LVM for patient to callback and schedule.  

## 2022-07-10 NOTE — Telephone Encounter (Signed)
Please call and schedule CPE with fasting labs prior for Dr. Diona Browner.

## 2022-09-05 ENCOUNTER — Telehealth: Payer: Self-pay | Admitting: *Deleted

## 2022-09-05 DIAGNOSIS — E7800 Pure hypercholesterolemia, unspecified: Secondary | ICD-10-CM

## 2022-09-05 DIAGNOSIS — Z114 Encounter for screening for human immunodeficiency virus [HIV]: Secondary | ICD-10-CM

## 2022-09-05 NOTE — Telephone Encounter (Signed)
-----   Message from Alvina Chou sent at 09/04/2022  4:02 PM EDT ----- Regarding: Lab orders for Thursday, 4.11.24 Patient is scheduled for CPX labs, please order future labs, Thanks , Camelia Eng

## 2022-09-07 ENCOUNTER — Other Ambulatory Visit: Payer: No Typology Code available for payment source

## 2022-09-07 ENCOUNTER — Other Ambulatory Visit (INDEPENDENT_AMBULATORY_CARE_PROVIDER_SITE_OTHER): Payer: Self-pay

## 2022-09-07 DIAGNOSIS — Z114 Encounter for screening for human immunodeficiency virus [HIV]: Secondary | ICD-10-CM

## 2022-09-07 DIAGNOSIS — E7800 Pure hypercholesterolemia, unspecified: Secondary | ICD-10-CM

## 2022-09-08 LAB — COMPREHENSIVE METABOLIC PANEL
ALT: 15 U/L (ref 0–35)
AST: 18 U/L (ref 0–37)
Albumin: 4.3 g/dL (ref 3.5–5.2)
Alkaline Phosphatase: 71 U/L (ref 39–117)
BUN: 13 mg/dL (ref 6–23)
CO2: 27 mEq/L (ref 19–32)
Calcium: 9.6 mg/dL (ref 8.4–10.5)
Chloride: 103 mEq/L (ref 96–112)
Creatinine, Ser: 1.19 mg/dL (ref 0.40–1.20)
GFR: 50.57 mL/min — ABNORMAL LOW (ref >60.00)
Glucose, Bld: 88 mg/dL (ref 70–99)
Potassium: 4.5 mEq/L (ref 3.5–5.1)
Sodium: 140 mEq/L (ref 135–145)
Total Bilirubin: 0.4 mg/dL (ref 0.2–1.2)
Total Protein: 6.7 g/dL (ref 6.0–8.3)

## 2022-09-08 LAB — LIPID PANEL
Cholesterol: 229 mg/dL — ABNORMAL HIGH (ref 0–200)
HDL: 62.7 mg/dL (ref >39.00)
LDL Cholesterol: 146 mg/dL — ABNORMAL HIGH (ref 0–99)
NonHDL: 166.37
Total CHOL/HDL Ratio: 4
Triglycerides: 104 mg/dL (ref 0.0–149.0)
VLDL: 20.8 mg/dL (ref 0.0–40.0)

## 2022-09-08 LAB — HIV ANTIBODY (ROUTINE TESTING W REFLEX): HIV 1&2 Ab, 4th Generation: NONREACTIVE

## 2022-09-08 NOTE — Progress Notes (Signed)
No critical labs need to be addressed urgently. We will discuss labs in detail at upcoming office visit.   

## 2022-09-15 ENCOUNTER — Ambulatory Visit (INDEPENDENT_AMBULATORY_CARE_PROVIDER_SITE_OTHER): Payer: Self-pay | Admitting: Family Medicine

## 2022-09-15 ENCOUNTER — Encounter: Payer: Self-pay | Admitting: Family Medicine

## 2022-09-15 ENCOUNTER — Other Ambulatory Visit (HOSPITAL_COMMUNITY)
Admission: RE | Admit: 2022-09-15 | Discharge: 2022-09-15 | Disposition: A | Discharge status: Home / Self Care | Payer: Self-pay | Source: Ambulatory Visit | Attending: Family Medicine | Admitting: Family Medicine

## 2022-09-15 VITALS — BP 120/82 | HR 94 | Temp 97.7°F | Ht 65.0 in | Wt 199.1 lb

## 2022-09-15 DIAGNOSIS — I10 Essential (primary) hypertension: Secondary | ICD-10-CM

## 2022-09-15 DIAGNOSIS — Z124 Encounter for screening for malignant neoplasm of cervix: Secondary | ICD-10-CM | POA: Insufficient documentation

## 2022-09-15 DIAGNOSIS — E7800 Pure hypercholesterolemia, unspecified: Secondary | ICD-10-CM

## 2022-09-15 DIAGNOSIS — Z1211 Encounter for screening for malignant neoplasm of colon: Secondary | ICD-10-CM

## 2022-09-15 DIAGNOSIS — Z0000 Encounter for general adult medical examination without abnormal findings: Secondary | ICD-10-CM

## 2022-09-15 MED ORDER — TRIAMCINOLONE ACETONIDE 0.1 % EX CREA: 1.0000 | TOPICAL_CREAM | Freq: Two times a day (BID) | CUTANEOUS | 0 refills | Status: DC | PRN

## 2022-09-15 MED ORDER — LISINOPRIL 20 MG PO TABS: 20.0000 mg | ORAL_TABLET | Freq: Every day | ORAL | 3 refills | Status: DC

## 2022-09-15 MED ORDER — ALPRAZOLAM 0.5 MG PO TABS: 0.5000 mg | ORAL_TABLET | Freq: Every evening | ORAL | 0 refills | Status: DC | PRN

## 2022-09-15 MED ORDER — ALBUTEROL SULFATE HFA 108 (90 BASE) MCG/ACT IN AERS: 2.0000 | INHALATION_SPRAY | Freq: Four times a day (QID) | RESPIRATORY_TRACT | 2 refills | Status: AC | PRN

## 2022-09-15 NOTE — Assessment & Plan Note (Addendum)
Moderate control, family history of CVA but at advanced age. Continue red yeast rice 1200 mg BID. Encouraged exercise, weight loss, healthy eating habits.   The 10-year ASCVD risk score (Arnett DK, et al., 2019) is: 3.2%   Values used to calculate the score:     Age: 57 years     Sex: Female     Is Non-Hispanic African American: No     Diabetic: No     Tobacco smoker: No     Systolic Blood Pressure: 120 mmHg     Is BP treated: Yes     HDL Cholesterol: 62.7 mg/dL     Total Cholesterol: 229 mg/dL  Encouraged exercise, weight loss, healthy eating habits.

## 2022-09-15 NOTE — Assessment & Plan Note (Signed)
Stable, chronic.  Continue current medication. ° ° ° On lisinopril[ril 20 mg daily °

## 2022-09-15 NOTE — Progress Notes (Signed)
Patient ID: JISELE PRICE, female    DOB: 09/25/1965, 57 y.o.   MRN: [id number]  This visit was conducted in person.  BP 120/82   Pulse 94   Temp 97.7 F (36.5 C) (Temporal)   Ht 5\' 5"  (1.651 m)   Wt 199 lb 2 oz (90.3 kg)   SpO2 96%   BMI 33.14 kg/m    CC:  Chief Complaint  Patient presents with   Annual Exam    Subjective:   HPI: TONEE SILVERSTEIN is a 57 y.o. female presenting on 09/15/2022 for Annual Exam  Doing well overall.  Hypertension:    At goal on lisinopril 20 mg daily BP Readings from Last 3 Encounters:  09/15/22 120/82  06/16/21 120/90  01/09/20 110/72  Using medication without problems or lightheadedness:  none Chest pain with exertion: none Edema:none Short of breath:none Average home BPs: not checking Other issues:  Elevated Cholesterol:  Using red yeast rice 1200 mg  BID, oatmeal daily, healthy eating MGF CVA 70s Lab Results  Component Value Date   CHOL 229 (H) 09/07/2022   HDL 62.70 09/07/2022   LDLCALC 146 (H) 09/07/2022   TRIG 104.0 09/07/2022   CHOLHDL 4 09/07/2022  The 10-year ASCVD risk score (Arnett DK, et al., 2019) is: 3.2%   Values used to calculate the score:     Age: 31 years     Sex: Female     Is Non-Hispanic African American: No     Diabetic: No     Tobacco smoker: No     Systolic Blood Pressure: 120 mmHg     Is BP treated: Yes     HDL Cholesterol: 62.7 mg/dL     Total Cholesterol: 229 mg/dL Using medications without problems: Muscle aches:  Diet compliance:good Exercise: walking daily Other complaints:  Wt Readings from Last 3 Encounters:  09/15/22 199 lb 2 oz (90.3 kg)  06/16/21 192 lb (87.1 kg)  01/09/20 184 lb (83.5 kg)   Mild intermittent asthma/reactive airways.  She tends to have wheeze with respiratory infection... requests albuterol inhaler.     Relevant past medical, surgical, family and social history reviewed and updated as indicated. Interim medical history since our last visit  reviewed. Allergies and medications reviewed and updated. Outpatient Medications Prior to Visit  Medication Sig Dispense Refill   albuterol (VENTOLIN HFA) 108 (90 Base) MCG/ACT inhaler Inhale 2 puffs into the lungs every 6 (six) hours as needed for wheezing or shortness of breath. 8 g 2   ALPRAZolam (XANAX) 0.5 MG tablet Take 1 tablet (0.5 mg total) by mouth at bedtime as needed for anxiety or sleep. 30 tablet 0   lisinopril (ZESTRIL) 20 MG tablet Take 1 tablet by mouth once daily 90 tablet 0   triamcinolone cream (KENALOG) 0.1 % Apply 1 application topically 2 (two) times daily as needed.     No facility-administered medications prior to visit.     Per HPI unless specifically indicated in ROS section below Review of Systems  Constitutional:  Negative for fatigue and fever.  HENT:  Negative for congestion.   Eyes:  Negative for pain.  Respiratory:  Negative for cough and shortness of breath.   Cardiovascular:  Negative for chest pain, palpitations and leg swelling.  Gastrointestinal:  Negative for abdominal pain.  Genitourinary:  Negative for dysuria and vaginal bleeding.  Musculoskeletal:  Negative for back pain.  Neurological:  Negative for syncope, light-headedness and headaches.  Psychiatric/Behavioral:  Negative for dysphoric mood.  Objective:  BP 120/82   Pulse 94   Temp 97.7 F (36.5 C) (Temporal)   Ht 5\' 5"  (1.651 m)   Wt 199 lb 2 oz (90.3 kg)   SpO2 96%   BMI 33.14 kg/m   Wt Readings from Last 3 Encounters:  09/15/22 199 lb 2 oz (90.3 kg)  06/16/21 192 lb (87.1 kg)  01/09/20 184 lb (83.5 kg)      Physical Exam Vitals and nursing note reviewed.  Constitutional:      General: She is not in acute distress.    Appearance: Normal appearance. She is well-developed. She is not ill-appearing or toxic-appearing.  HENT:     Head: Normocephalic.     Right Ear: Hearing, tympanic membrane, ear canal and external ear normal.     Left Ear: Hearing, tympanic membrane, ear  canal and external ear normal.     Nose: Nose normal.  Eyes:     General: Lids are normal. Lids are everted, no foreign bodies appreciated.     Conjunctiva/sclera: Conjunctivae normal.     Pupils: Pupils are equal, round, and reactive to light.  Neck:     Thyroid: No thyroid mass or thyromegaly.     Vascular: No carotid bruit.     Trachea: Trachea normal.  Cardiovascular:     Rate and Rhythm: Normal rate and regular rhythm.     Heart sounds: Normal heart sounds, S1 normal and S2 normal. No murmur heard.    No gallop.  Pulmonary:     Effort: Pulmonary effort is normal. No respiratory distress.     Breath sounds: Normal breath sounds. No wheezing, rhonchi or rales.  Abdominal:     General: Bowel sounds are normal. There is no distension or abdominal bruit.     Palpations: Abdomen is soft. There is no fluid wave or mass.     Tenderness: There is no abdominal tenderness. There is no guarding or rebound.     Hernia: No hernia is present.  Genitourinary:    Exam position: Supine.     Labia:        Right: No rash, tenderness or lesion.        Left: No rash, tenderness or lesion.      Vagina: Normal.     Cervix: No cervical motion tenderness, discharge or friability.     Uterus: Not enlarged and not tender.      Adnexa:        Right: No mass, tenderness or fullness.         Left: No mass, tenderness or fullness.    Musculoskeletal:     Cervical back: Normal range of motion and neck supple.  Lymphadenopathy:     Cervical: No cervical adenopathy.  Skin:    General: Skin is warm and dry.     Findings: No rash.  Neurological:     Mental Status: She is alert.     Cranial Nerves: No cranial nerve deficit.     Sensory: No sensory deficit.  Psychiatric:        Mood and Affect: Mood is not anxious or depressed.        Speech: Speech normal.        Behavior: Behavior normal. Behavior is cooperative.        Judgment: Judgment normal.       Results for orders placed or performed in  visit on 09/07/22  HIV Antibody (routine testing w rflx)  Result Value Ref Range   HIV  1&2 Ab, 4th Generation NON-REACTIVE NON-REACTIVE  Lipid panel  Result Value Ref Range   Cholesterol 229 (H) 0 - 200 mg/dL   Triglycerides 161.0 0.0 - 149.0 mg/dL   HDL 96.04 >54.09 mg/dL   VLDL 81.1 0.0 - 91.4 mg/dL   LDL Cholesterol 782 (H) 0 - 99 mg/dL   Total CHOL/HDL Ratio 4    NonHDL 166.37   Comprehensive metabolic panel  Result Value Ref Range   Sodium 140 135 - 145 mEq/L   Potassium 4.5 3.5 - 5.1 mEq/L   Chloride 103 96 - 112 mEq/L   CO2 27 19 - 32 mEq/L   Glucose, Bld 88 70 - 99 mg/dL   BUN 13 6 - 23 mg/dL   Creatinine, Ser 9.56 0.40 - 1.20 mg/dL   Total Bilirubin 0.4 0.2 - 1.2 mg/dL   Alkaline Phosphatase 71 39 - 117 U/L   AST 18 0 - 37 U/L   ALT 15 0 - 35 U/L   Total Protein 6.7 6.0 - 8.3 g/dL   Albumin 4.3 3.5 - 5.2 g/dL   GFR 21.30 (L) >86.57 mL/min   Calcium 9.6 8.4 - 10.5 mg/dL    This visit occurred during the SARS-CoV-2 public health emergency.  Safety protocols were in place, including screening questions prior to the visit, additional usage of staff PPE, and extensive cleaning of exam room while observing appropriate contact time as indicated for disinfecting solutions.   COVID 19 screen:  No recent travel or known exposure to COVID19 The patient denies respiratory symptoms of COVID 19 at this time. The importance of social distancing was discussed today.   Assessment and Plan The patient's preventative maintenance and recommended screening tests for an annual wellness exam were reviewed in full today. Brought up to date unless services declined.  Counselled on the importance of diet, exercise, and its role in overall health and mortality. The patient's FH and SH was reviewed, including their home life, tobacco status, and drug and alcohol status.   Vaccines:Tdap, COVID19 uptodate Consider shingrix (pharmacy ot HD), refused flu Colon: No early family  history.   Colonoscopy:   01/2015 DrMarland Kitchen Russella Dar polyps: recommend repeat in 5 year .. Due.  PAP/DVE: 02/05/2017 nml, neg HPV, plan repeat in 5 years, no family history ovarian uterine cancer... DUE today  Mammo: 05/2022  STD screening: Refused.  No smoker  No ETOH use.     Problem List Items Addressed This Visit     Essential hypertension    Stable, chronic.  Continue current medication.    On lisinopril[ril 20 mg daily      HYPERCHOLESTEROLEMIA     Moderate control, family history of CVA but at advanced age. Continue red yeast rice 1200 mg BID. Encouraged exercise, weight loss, healthy eating habits.   The 10-year ASCVD risk score (Arnett DK, et al., 2019) is: 3.2%   Values used to calculate the score:     Age: 46 years     Sex: Female     Is Non-Hispanic African American: No     Diabetic: No     Tobacco smoker: No     Systolic Blood Pressure: 120 mmHg     Is BP treated: Yes     HDL Cholesterol: 62.7 mg/dL     Total Cholesterol: 229 mg/dL  Encouraged exercise, weight loss, healthy eating habits.       Other Visit Diagnoses     Routine general medical examination at a health care facility    -  Primary   Cervical cancer screening       Relevant Orders   Cytology - PAP([place])   Colon cancer screening       Relevant Orders   Fecal occult blood, imunochemical       Kerby Nora, MD

## 2022-09-15 NOTE — Patient Instructions (Signed)
Pick up stool cards  in lab on way out.

## 2022-09-19 LAB — FECAL OCCULT BLOOD, IMMUNOCHEMICAL: Fecal Occult Bld: NEGATIVE

## 2022-09-21 LAB — CYTOLOGY - PAP
Comment: NEGATIVE
Diagnosis: NEGATIVE
High risk HPV: NEGATIVE

## 2022-10-27 IMAGING — MG MM DIGITAL SCREENING BILAT W/ TOMO AND CAD
8 series · 8 of 24 positions shown · non-contrast
Comparison: Previous exam(s).

CLINICAL DATA: Screening.

EXAM:
DIGITAL SCREENING BILATERAL MAMMOGRAM WITH TOMOSYNTHESIS AND CAD
TECHNIQUE: Bilateral screening digital craniocaudal and mediolateral oblique
mammograms were obtained. Bilateral screening digital breast
tomosynthesis was performed. The images were evaluated with
computer-aided detection.

[L MLO synth-2D]
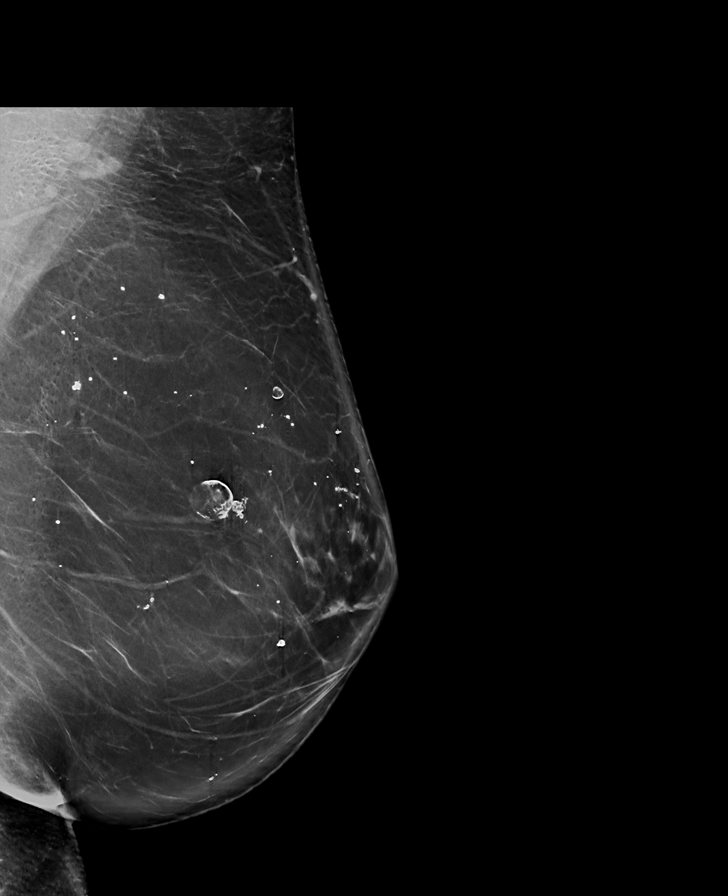

[L CC synth-2D]
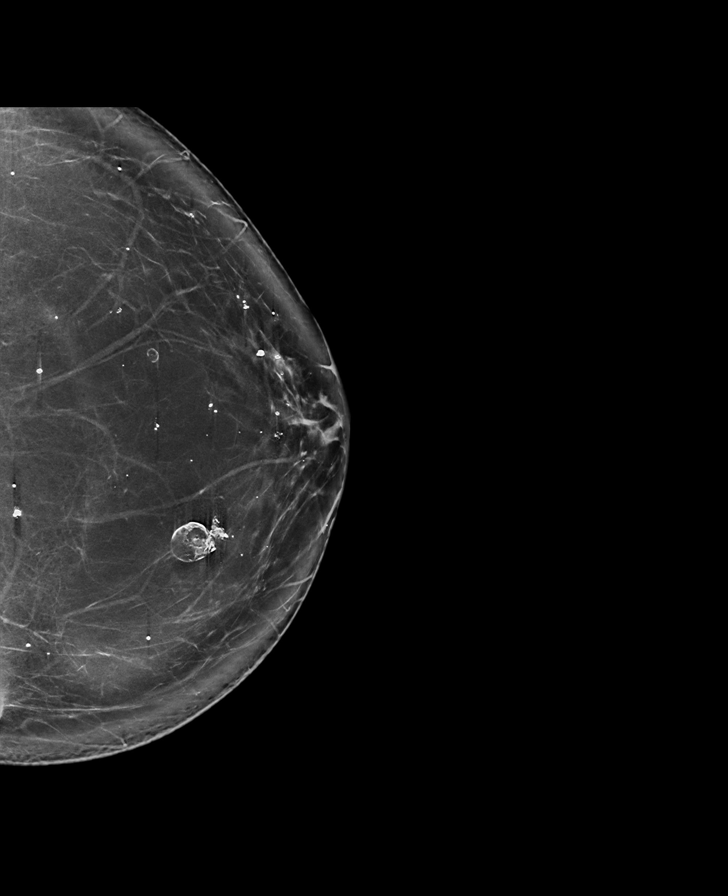

[R MLO synth-2D]
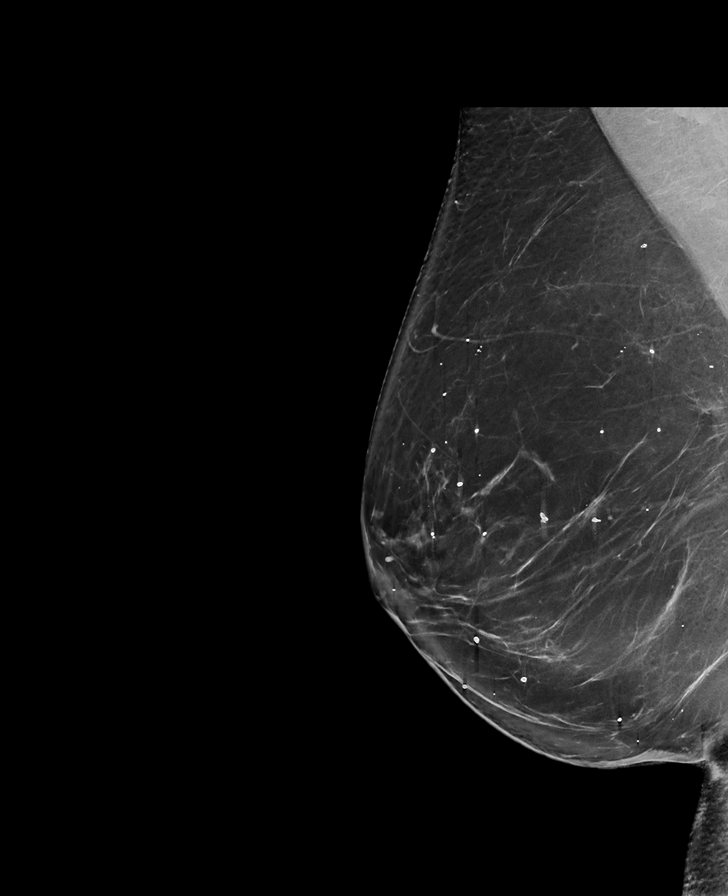

[R CC synth-2D]
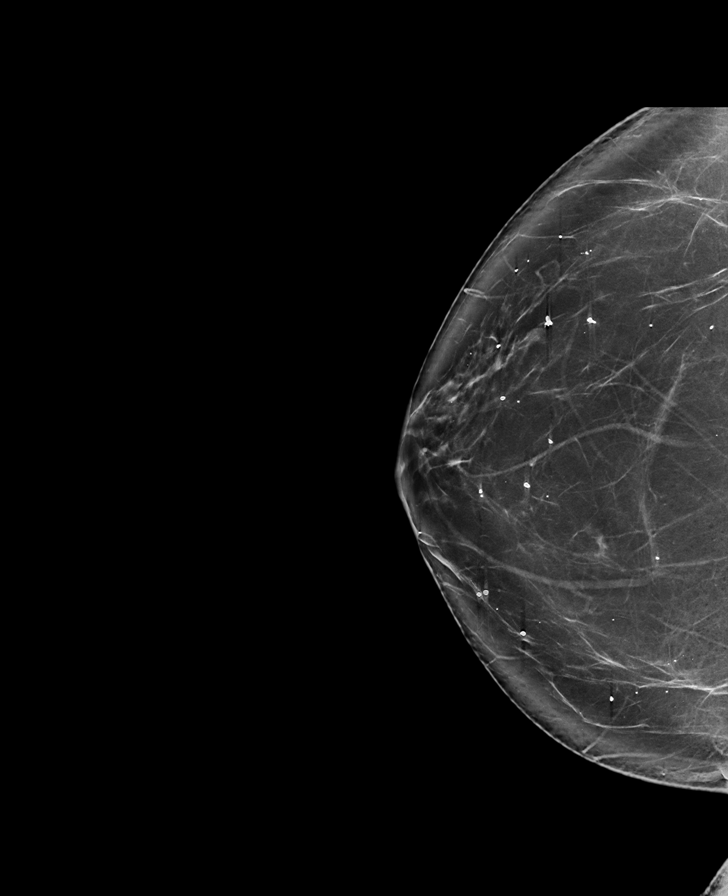

[R CC tomo · tomo slice 48/95.0]
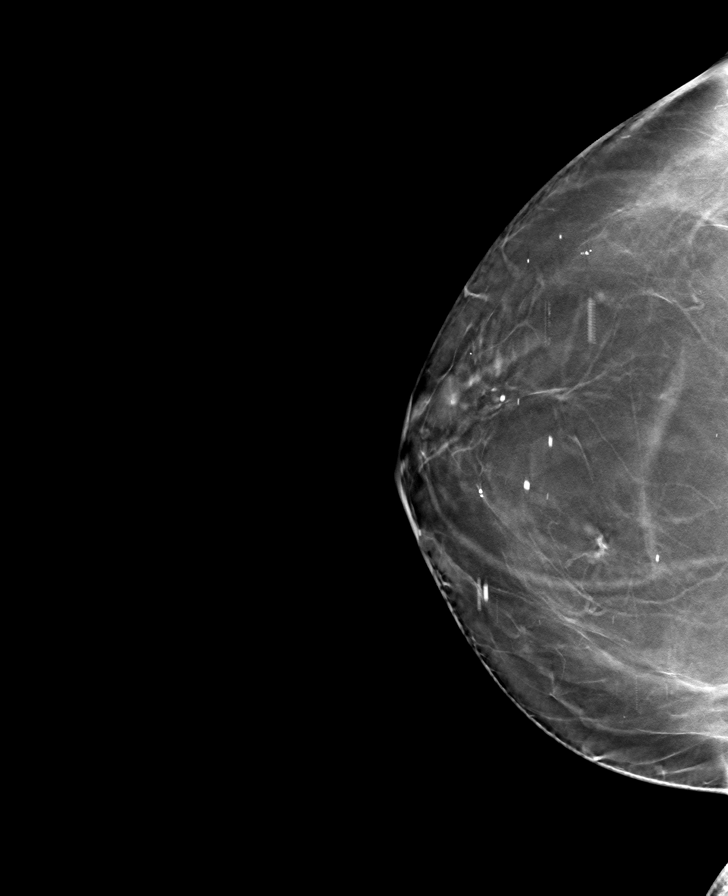

[R MLO tomo · tomo slice 49/96.0]
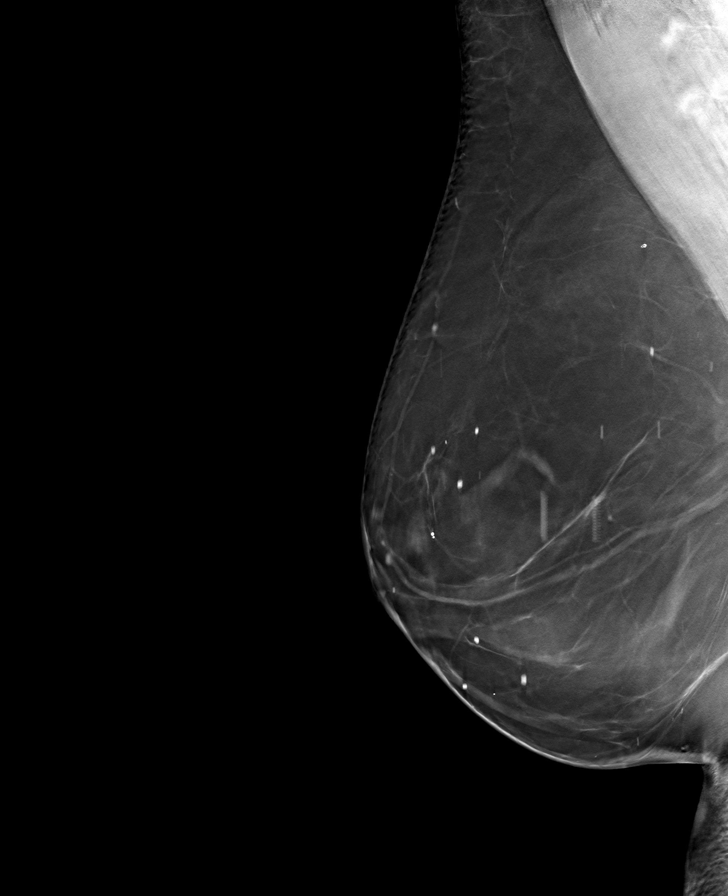

[L CC tomo · tomo slice 47/94.0]
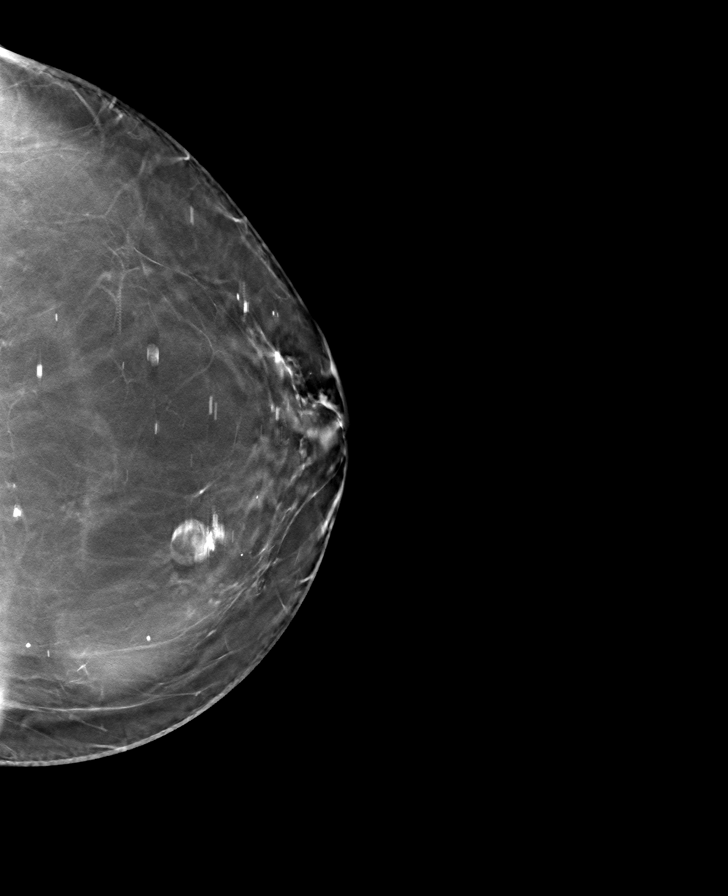

[L MLO tomo · tomo slice 52/103.0]
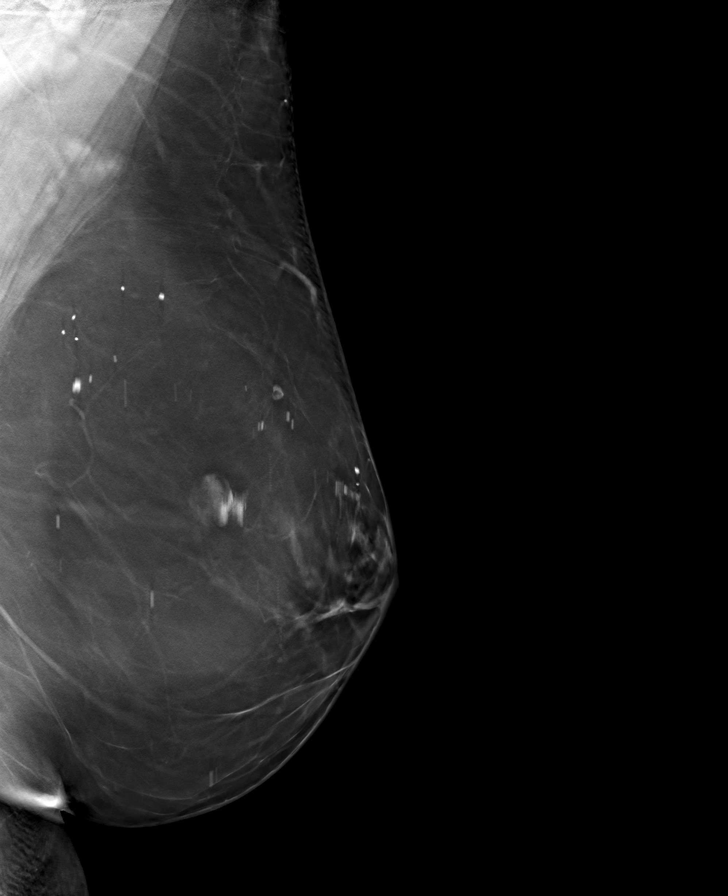

[8 of 24 positions shown; findings below may reference images not displayed]

ACR Breast Density Category b: There are scattered areas of
fibroglandular density.
FINDINGS: There are no findings suspicious for malignancy. The images were
evaluated with computer-aided detection.
IMPRESSION: No mammographic evidence of malignancy. A result letter of this
screening mammogram will be mailed directly to the patient.

RECOMMENDATION:
Screening mammogram in one year. (Code:[id number])

BI-RADS CATEGORY  1: Negative.

## 2023-07-30 ENCOUNTER — Other Ambulatory Visit: Payer: Self-pay | Admitting: Family Medicine

## 2023-07-30 NOTE — Telephone Encounter (Signed)
 Lvmtcb, sent mychart message

## 2023-07-30 NOTE — Telephone Encounter (Signed)
 Last office visit 09/15/2022 for CPE.  Last refilled alprazolam 09/15/22 for #30 with no refills.  Triamcinolone 09/15/2022 for 30 g with no refills.  Next Appt: No future appointments.   Please schedule CPE with fasting labs prior with Dr. Ermalene Searing after 09/15/2023.

## 2023-07-30 NOTE — Telephone Encounter (Signed)
 Call pt and schedule a appt for cpe / labs

## 2023-07-31 NOTE — Telephone Encounter (Signed)
 Patient has been scheduled

## 2023-08-01 MED ORDER — ALPRAZOLAM 0.5 MG PO TABS: 0.5000 mg | ORAL_TABLET | Freq: Every evening | ORAL | 0 refills | Status: AC | PRN

## 2023-08-01 MED ORDER — TRIAMCINOLONE ACETONIDE 0.1 % EX CREA: 1.0000 | TOPICAL_CREAM | Freq: Two times a day (BID) | CUTANEOUS | 0 refills | Status: AC | PRN

## 2023-08-27 ENCOUNTER — Telehealth: Payer: Self-pay | Admitting: *Deleted

## 2023-08-27 DIAGNOSIS — E7800 Pure hypercholesterolemia, unspecified: Secondary | ICD-10-CM

## 2023-08-27 NOTE — Telephone Encounter (Signed)
-----   Message from Alvina Chou sent at 08/27/2023 11:37 AM EDT ----- Regarding: Lab orders for Tue, 4.15.25 Patient is scheduled for CPX labs, please order future labs, Thanks , Camelia Eng

## 2023-09-11 ENCOUNTER — Encounter: Payer: Self-pay | Admitting: Family Medicine

## 2023-09-11 ENCOUNTER — Other Ambulatory Visit (INDEPENDENT_AMBULATORY_CARE_PROVIDER_SITE_OTHER): Payer: Self-pay

## 2023-09-11 DIAGNOSIS — E7800 Pure hypercholesterolemia, unspecified: Secondary | ICD-10-CM

## 2023-09-11 LAB — COMPREHENSIVE METABOLIC PANEL WITH GFR
ALT: 20 U/L (ref 0–35)
AST: 20 U/L (ref 0–37)
Albumin: 4.5 g/dL (ref 3.5–5.2)
Alkaline Phosphatase: 65 U/L (ref 39–117)
BUN: 13 mg/dL (ref 6–23)
CO2: 28 meq/L (ref 19–32)
Calcium: 9.4 mg/dL (ref 8.4–10.5)
Chloride: 103 meq/L (ref 96–112)
Creatinine, Ser: 0.98 mg/dL (ref 0.40–1.20)
GFR: 63.39 mL/min (ref >60.00)
Glucose, Bld: 90 mg/dL (ref 70–99)
Potassium: 4.2 meq/L (ref 3.5–5.1)
Sodium: 138 meq/L (ref 135–145)
Total Bilirubin: 0.6 mg/dL (ref 0.2–1.2)
Total Protein: 7.1 g/dL (ref 6.0–8.3)

## 2023-09-11 LAB — LIPID PANEL
Cholesterol: 229 mg/dL — ABNORMAL HIGH (ref 0–200)
HDL: 60.1 mg/dL (ref >39.00)
LDL Cholesterol: 137 mg/dL — ABNORMAL HIGH (ref 0–99)
NonHDL: 168.45
Total CHOL/HDL Ratio: 4
Triglycerides: 158 mg/dL — ABNORMAL HIGH (ref 0.0–149.0)
VLDL: 31.6 mg/dL (ref 0.0–40.0)

## 2023-09-11 NOTE — Progress Notes (Signed)
 No critical labs need to be addressed urgently. We will discuss labs in detail at upcoming office visit.

## 2023-09-14 ENCOUNTER — Other Ambulatory Visit

## 2023-09-19 ENCOUNTER — Ambulatory Visit (INDEPENDENT_AMBULATORY_CARE_PROVIDER_SITE_OTHER): Payer: Self-pay | Admitting: Family Medicine

## 2023-09-19 ENCOUNTER — Encounter: Payer: Self-pay | Admitting: Family Medicine

## 2023-09-19 VITALS — BP 134/84 | HR 80 | Temp 98.0°F | Ht 64.92 in | Wt 205.6 lb

## 2023-09-19 DIAGNOSIS — E7800 Pure hypercholesterolemia, unspecified: Secondary | ICD-10-CM

## 2023-09-19 DIAGNOSIS — J683 Other acute and subacute respiratory conditions due to chemicals, gases, fumes and vapors: Secondary | ICD-10-CM

## 2023-09-19 DIAGNOSIS — Z0000 Encounter for general adult medical examination without abnormal findings: Secondary | ICD-10-CM

## 2023-09-19 DIAGNOSIS — I10 Essential (primary) hypertension: Secondary | ICD-10-CM

## 2023-09-19 MED ORDER — LOSARTAN POTASSIUM 50 MG PO TABS: 50.0000 mg | ORAL_TABLET | Freq: Every day | ORAL | 3 refills | Status: DC

## 2023-09-19 NOTE — Progress Notes (Signed)
 Patient ID: Abigail Stout, female    DOB: Sep 08, 1965, 58 y.o.   MRN: [id number]  This visit was conducted in person.  BP 134/84   Pulse 80   Temp 98 F (36.7 C) (Oral)   Ht 5' 4.92" (1.649 m)   Wt 205 lb 9.6 oz (93.3 kg)   SpO2 98%   BMI 34.30 kg/m    CC:  Chief Complaint  Patient presents with   Annual Exam    Patient is wanting to know should she change her lisinopril  due to a dry cough    Subjective:   HPI: Abigail Stout is a 58 y.o. female presenting on 09/19/2023 for Annual Exam (Patient is wanting to know should she change her lisinopril  due to a dry cough)    Hypertension:    At goal on lisinopril  20 mg daily BP Readings from Last 3 Encounters:  09/19/23 134/84  09/15/22 120/82  06/16/21 120/90  Using medication without problems or lightheadedness:   dry cough. Chest pain with exertion: none Edema:none Short of breath:none Average home BPs: not checking Other issues:  Elevated Cholesterol:  Using red yeast rice 1200 mg  BID, oatmeal daily, healthy eating MGF CVA 70s Lab Results  Component Value Date   CHOL 229 (H) 09/11/2023   HDL 60.10 09/11/2023   LDLCALC 137 (H) 09/11/2023   TRIG 158.0 (H) 09/11/2023   CHOLHDL 4 09/11/2023  The 10-year ASCVD risk score (Arnett DK, et al., 2019) is: 4.6%   Values used to calculate the score:     Age: 85 years     Sex: Female     Is Non-Hispanic African American: No     Diabetic: No     Tobacco smoker: No     Systolic Blood Pressure: 134 mmHg     Is BP treated: Yes     HDL Cholesterol: 60.1 mg/dL     Total Cholesterol: 229 mg/dL Using medications without problems: Muscle aches:  Diet compliance:good Exercise: walking  off and on, swimming in summer. Other complaints:  Wt Readings from Last 3 Encounters:  09/19/23 205 lb 9.6 oz (93.3 kg)  09/15/22 199 lb 2 oz (90.3 kg)  06/16/21 192 lb (87.1 kg)   Mild intermittent asthma/reactive airways. She tends to have wheeze with respiratory infection...  requests albuterol  inhaler.     Relevant past medical, surgical, family and social history reviewed and updated as indicated. Interim medical history since our last visit reviewed. Allergies and medications reviewed and updated. Outpatient Medications Prior to Visit  Medication Sig Dispense Refill   albuterol  (VENTOLIN  HFA) 108 (90 Base) MCG/ACT inhaler Inhale 2 puffs into the lungs every 6 (six) hours as needed for wheezing or shortness of breath. 8 g 2   ALPRAZolam  (XANAX ) 0.5 MG tablet Take 1 tablet (0.5 mg total) by mouth at bedtime as needed for anxiety or sleep. 30 tablet 0   triamcinolone  cream (KENALOG ) 0.1 % Apply 1 Application topically 2 (two) times daily as needed. 30 g 0   lisinopril  (ZESTRIL ) 20 MG tablet Take 1 tablet (20 mg total) by mouth daily. 90 tablet 3   No facility-administered medications prior to visit.     Per HPI unless specifically indicated in ROS section below Review of Systems  Constitutional:  Negative for fatigue and fever.  HENT:  Negative for congestion.   Eyes:  Negative for pain.  Respiratory:  Negative for cough and shortness of breath.   Cardiovascular:  Negative for chest pain,  palpitations and leg swelling.  Gastrointestinal:  Negative for abdominal pain.  Genitourinary:  Negative for dysuria and vaginal bleeding.  Musculoskeletal:  Negative for back pain.  Neurological:  Negative for syncope, light-headedness and headaches.  Psychiatric/Behavioral:  Negative for dysphoric mood.    Objective:  BP 134/84   Pulse 80   Temp 98 F (36.7 C) (Oral)   Ht 5' 4.92" (1.649 m)   Wt 205 lb 9.6 oz (93.3 kg)   SpO2 98%   BMI 34.30 kg/m   Wt Readings from Last 3 Encounters:  09/19/23 205 lb 9.6 oz (93.3 kg)  09/15/22 199 lb 2 oz (90.3 kg)  06/16/21 192 lb (87.1 kg)      Physical Exam Vitals and nursing note reviewed.  Constitutional:      General: She is not in acute distress.    Appearance: Normal appearance. She is well-developed. She is not  ill-appearing or toxic-appearing.  HENT:     Head: Normocephalic.     Right Ear: Hearing, tympanic membrane, ear canal and external ear normal.     Left Ear: Hearing, tympanic membrane, ear canal and external ear normal.     Nose: Nose normal.  Eyes:     General: Lids are normal. Lids are everted, no foreign bodies appreciated.     Conjunctiva/sclera: Conjunctivae normal.     Pupils: Pupils are equal, round, and reactive to light.  Neck:     Thyroid: No thyroid mass or thyromegaly.     Vascular: No carotid bruit.     Trachea: Trachea normal.  Cardiovascular:     Rate and Rhythm: Normal rate and regular rhythm.     Heart sounds: Normal heart sounds, S1 normal and S2 normal. No murmur heard.    No gallop.  Pulmonary:     Effort: Pulmonary effort is normal. No respiratory distress.     Breath sounds: Normal breath sounds. No wheezing, rhonchi or rales.  Abdominal:     General: Bowel sounds are normal. There is no distension or abdominal bruit.     Palpations: Abdomen is soft. There is no fluid wave or mass.     Tenderness: There is no abdominal tenderness. There is no guarding or rebound.     Hernia: No hernia is present.  Genitourinary:    Exam position: Supine.     Labia:        Right: No rash, tenderness or lesion.        Left: No rash, tenderness or lesion.      Vagina: Normal.     Cervix: No cervical motion tenderness, discharge or friability.     Uterus: Not enlarged and not tender.      Adnexa:        Right: No mass, tenderness or fullness.         Left: No mass, tenderness or fullness.    Musculoskeletal:     Cervical back: Normal range of motion and neck supple.  Lymphadenopathy:     Cervical: No cervical adenopathy.  Skin:    General: Skin is warm and dry.     Findings: No rash.  Neurological:     Mental Status: She is alert.     Cranial Nerves: No cranial nerve deficit.     Sensory: No sensory deficit.  Psychiatric:        Mood and Affect: Mood is not anxious  or depressed.        Speech: Speech normal.  Behavior: Behavior normal. Behavior is cooperative.        Judgment: Judgment normal.       Results for orders placed or performed in visit on 09/11/23  Lipid panel   Collection Time: 09/11/23  7:33 AM  Result Value Ref Range   Cholesterol 229 (H) 0 - 200 mg/dL   Triglycerides 147.8 (H) 0.0 - 149.0 mg/dL   HDL 29.56 >21.30 mg/dL   VLDL 86.5 0.0 - 78.4 mg/dL   LDL Cholesterol 696 (H) 0 - 99 mg/dL   Total CHOL/HDL Ratio 4    NonHDL 168.45   Comprehensive metabolic panel with GFR   Collection Time: 09/11/23  7:33 AM  Result Value Ref Range   Sodium 138 135 - 145 mEq/L   Potassium 4.2 3.5 - 5.1 mEq/L   Chloride 103 96 - 112 mEq/L   CO2 28 19 - 32 mEq/L   Glucose, Bld 90 70 - 99 mg/dL   BUN 13 6 - 23 mg/dL   Creatinine, Ser 2.95 0.40 - 1.20 mg/dL   Total Bilirubin 0.6 0.2 - 1.2 mg/dL   Alkaline Phosphatase 65 39 - 117 U/L   AST 20 0 - 37 U/L   ALT 20 0 - 35 U/L   Total Protein 7.1 6.0 - 8.3 g/dL   Albumin 4.5 3.5 - 5.2 g/dL   GFR 28.41 >32.44 mL/min   Calcium 9.4 8.4 - 10.5 mg/dL    This visit occurred during the SARS-CoV-2 public health emergency.  Safety protocols were in place, including screening questions prior to the visit, additional usage of staff PPE, and extensive cleaning of exam room while observing appropriate contact time as indicated for disinfecting solutions.   COVID 19 screen:  No recent travel or known exposure to COVID19 The patient denies respiratory symptoms of COVID 19 at this time. The importance of social distancing was discussed today.   Assessment and Plan The patient's preventative maintenance and recommended screening tests for an annual wellness exam were reviewed in full today. Brought up to date unless services declined.  Counselled on the importance of diet, exercise, and its role in overall health and mortality. The patient's FH and SH was reviewed, including their home life, tobacco  status, and drug and alcohol status.   Vaccines:Tdap, COVID19 uptodate Consider shingrix (pharmacy ot HD), refused flu Colon: No early family  history.  Colonoscopy:   01/2015 DrAaron Aas Sandrea Cruel polyps: recommend repeat in 5 year .Aaron Aas IFOB negative 2024.. due for repeat.. will consider.  PAP/DVE: 02/05/2017 nml, neg HPV, plan repeat in 5 years, no family history ovarian uterine cancer... neg 2024  Mammo: 05/2022, due  STD screening: Refused.  No smoker  No ETOH use.     Problem List Items Addressed This Visit     Essential hypertension   Stable, chronic.   Change to ARB given cough    On losartan  50 mg daily      Relevant Medications   losartan  (COZAAR ) 50 MG tablet   HYPERCHOLESTEROLEMIA    Moderate control, family history of CVA but at advanced age. Continue red yeast rice 1200 mg BID. Encouraged exercise, weight loss, healthy eating habits.   The 10-year ASCVD risk score (Arnett DK, et al., 2019) is: 4.6%   Values used to calculate the score:     Age: 68 years     Sex: Female     Is Non-Hispanic African American: No     Diabetic: No     Tobacco smoker: No  Systolic Blood Pressure: 134 mmHg     Is BP treated: Yes     HDL Cholesterol: 60.1 mg/dL     Total Cholesterol: 229 mg/dL  Encouraged exercise, weight loss, healthy eating habits.       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      Other Visit Diagnoses       Routine general medical examination at a health care facility    -  Primary        Herby Lolling, MD

## 2023-09-19 NOTE — Assessment & Plan Note (Signed)
Use albuterol prn. °

## 2023-09-19 NOTE — Patient Instructions (Signed)
 Stop lisinopril  and change to losartan .  Follow BP at home, goal   <140/90.

## 2023-09-19 NOTE — Assessment & Plan Note (Signed)
 Stable, chronic.   Change to ARB given cough    On losartan  50 mg daily

## 2023-09-19 NOTE — Assessment & Plan Note (Signed)
 Moderate control, family history of CVA but at advanced age. Continue red yeast rice 1200 mg BID. Encouraged exercise, weight loss, healthy eating habits.   The 10-year ASCVD risk score (Arnett DK, et al., 2019) is: 4.6%   Values used to calculate the score:     Age: 58 years     Sex: Female     Is Non-Hispanic African American: No     Diabetic: No     Tobacco smoker: No     Systolic Blood Pressure: 134 mmHg     Is BP treated: Yes     HDL Cholesterol: 60.1 mg/dL     Total Cholesterol: 229 mg/dL  Encouraged exercise, weight loss, healthy eating habits.

## 2023-10-01 ENCOUNTER — Other Ambulatory Visit: Payer: Self-pay | Admitting: Family Medicine

## 2023-11-08 ENCOUNTER — Encounter: Payer: Self-pay | Admitting: Family Medicine

## 2023-11-09 MED ORDER — MINOXIDIL FOR WOMEN 2 % EX SOLN: Freq: Two times a day (BID) | CUTANEOUS | 0 refills | Status: AC

## 2024-04-10 ENCOUNTER — Encounter: Payer: Self-pay | Admitting: Family Medicine

## 2024-04-10 MED ORDER — VALACYCLOVIR HCL 1 G PO TABS: 1000.0000 mg | ORAL_TABLET | Freq: Three times a day (TID) | ORAL | 0 refills | Status: AC

## 2024-05-27 ENCOUNTER — Encounter: Payer: Self-pay | Admitting: Family Medicine

## 2024-06-23 ENCOUNTER — Other Ambulatory Visit: Payer: Self-pay | Admitting: Family Medicine

## 2024-06-23 MED ORDER — AMLODIPINE BESYLATE 5 MG PO TABS: 5.0000 mg | ORAL_TABLET | Freq: Every day | ORAL | 11 refills | Status: DC

## 2024-06-23 MED ORDER — LOSARTAN POTASSIUM 100 MG PO TABS: 100.0000 mg | ORAL_TABLET | Freq: Every day | ORAL | 0 refills | Status: DC

## 2024-06-23 NOTE — Telephone Encounter (Signed)
 Dr. Avelina notified of this MyChart message via telephone.  She will address message.

## 2024-06-25 ENCOUNTER — Ambulatory Visit: Payer: Self-pay | Admitting: Family Medicine

## 2024-06-25 ENCOUNTER — Encounter: Payer: Self-pay | Admitting: Family Medicine

## 2024-06-25 VITALS — BP 142/112 | HR 90 | Temp 98.1°F | Ht 64.92 in | Wt 201.4 lb

## 2024-06-25 DIAGNOSIS — L659 Nonscarring hair loss, unspecified: Secondary | ICD-10-CM | POA: Insufficient documentation

## 2024-06-25 DIAGNOSIS — I10 Essential (primary) hypertension: Secondary | ICD-10-CM

## 2024-06-25 LAB — COMPREHENSIVE METABOLIC PANEL WITH GFR
ALT: 22 U/L (ref 3–35)
AST: 22 U/L (ref 5–37)
Albumin: 4.5 g/dL (ref 3.5–5.2)
Alkaline Phosphatase: 65 U/L (ref 39–117)
BUN: 15 mg/dL (ref 6–23)
CO2: 31 meq/L (ref 19–32)
Calcium: 9.7 mg/dL (ref 8.4–10.5)
Chloride: 104 meq/L (ref 96–112)
Creatinine, Ser: 0.95 mg/dL (ref 0.40–1.20)
GFR: 65.44 mL/min
Glucose, Bld: 95 mg/dL (ref 70–99)
Potassium: 3.8 meq/L (ref 3.5–5.1)
Sodium: 140 meq/L (ref 135–145)
Total Bilirubin: 0.6 mg/dL (ref 0.2–1.2)
Total Protein: 7.3 g/dL (ref 6.0–8.3)

## 2024-06-25 LAB — TSH: TSH: 2.36 u[IU]/mL (ref 0.35–5.50)

## 2024-06-25 LAB — CBC WITH DIFFERENTIAL/PLATELET
Basophils Absolute: 0 10*3/uL (ref 0.0–0.1)
Basophils Relative: 0.6 % (ref 0.0–3.0)
Eosinophils Absolute: 0.1 10*3/uL (ref 0.0–0.7)
Eosinophils Relative: 1.7 % (ref 0.0–5.0)
HCT: 44.9 % (ref 36.0–46.0)
Hemoglobin: 15.2 g/dL — ABNORMAL HIGH (ref 12.0–15.0)
Lymphocytes Relative: 30.3 % (ref 12.0–46.0)
Lymphs Abs: 2 10*3/uL (ref 0.7–4.0)
MCHC: 34 g/dL (ref 30.0–36.0)
MCV: 91.6 fl (ref 78.0–100.0)
Monocytes Absolute: 0.6 10*3/uL (ref 0.1–1.0)
Monocytes Relative: 9.7 % (ref 3.0–12.0)
Neutro Abs: 3.8 10*3/uL (ref 1.4–7.7)
Neutrophils Relative %: 57.7 % (ref 43.0–77.0)
Platelets: 225 10*3/uL (ref 150.0–400.0)
RBC: 4.9 Mil/uL (ref 3.87–5.11)
RDW: 13.5 % (ref 11.5–15.5)
WBC: 6.6 10*3/uL (ref 4.0–10.5)

## 2024-06-25 MED ORDER — AMLODIPINE BESYLATE 5 MG PO TABS: 10.0000 mg | ORAL_TABLET | Freq: Every day | ORAL | Status: AC

## 2024-06-25 NOTE — Progress Notes (Signed)
 "   Patient ID: Abigail Stout, female    DOB: Nov 09, 1965, 59 y.o.   MRN: [id number]  This visit was conducted in person.  BP (!) 142/112   Pulse 90   Temp 98.1 F (36.7 C) (Oral)   Ht 5' 4.92 (1.649 m)   Wt 201 lb 6 oz (91.3 kg)   SpO2 96%   BMI 33.59 kg/m    CC:  Chief Complaint  Patient presents with   Hypertension    Pt is here for HTN.     Subjective:   HPI: Abigail Stout is a 59 y.o. female presenting on 06/25/2024 for Hypertension (Pt is here for HTN. )  Hypertension:   Recent blood pressure elevations.  Losartan  was increased to 100 mg daily without significant reduction in blood pressure.  Additional amlodipine  5 mg was added 3 days ago. ACE inhibitor caused cough. BP Readings from Last 3 Encounters:  06/25/24 (!) 142/112  09/19/23 134/84  09/15/22 120/82  Using medication without problems or lightheadedness: None  Has been having headaches Chest pain with exertion: Edema:none  in ankles, some swelling in right MCP joints of hands. Short of breath: none Average home BPs: Other issues:  She is likely going through menopause.. has hot flashes  No weight gain.  She exercising 3-4 times a week.. walking 40 min.  Minimal salt added in diet.    No pain.  She has one large cup coffee a day... less than previous.  No new meds/ supplement. No decongestant.  No change in stress levels. Wt Readings from Last 3 Encounters:  06/25/24 201 lb 6 oz (91.3 kg)  09/19/23 205 lb 9.6 oz (93.3 kg)  09/15/22 199 lb 2 oz (90.3 kg)     Relevant past medical, surgical, family and social history reviewed and updated as indicated. Interim medical history since our last visit reviewed. Allergies and medications reviewed and updated. Outpatient Medications Prior to Visit  Medication Sig Dispense Refill   albuterol  (VENTOLIN  HFA) 108 (90 Base) MCG/ACT inhaler Inhale 2 puffs into the lungs every 6 (six) hours as needed for wheezing or shortness of breath. 8 g 2   ALPRAZolam   (XANAX ) 0.5 MG tablet Take 1 tablet (0.5 mg total) by mouth at bedtime as needed for anxiety or sleep. 30 tablet 0   losartan  (COZAAR ) 100 MG tablet Take 1 tablet (100 mg total) by mouth daily. 90 tablet 0   minoxidil  (MINOXIDIL  FOR WOMEN) 2 % external solution Apply topically 2 (two) times daily. 60 mL 0   triamcinolone  cream (KENALOG ) 0.1 % Apply 1 Application topically 2 (two) times daily as needed. 30 g 0   amLODipine  (NORVASC ) 5 MG tablet Take 1 tablet (5 mg total) by mouth daily. 30 tablet 11   valACYclovir  (VALTREX ) 1000 MG tablet Take 1 tablet (1,000 mg total) by mouth 3 (three) times daily. 21 tablet 0   No facility-administered medications prior to visit.     Per HPI unless specifically indicated in ROS section below Review of Systems  Constitutional:  Negative for fatigue and fever.  HENT:  Negative for congestion.   Eyes:  Negative for pain.  Respiratory:  Negative for cough and shortness of breath.   Cardiovascular:  Negative for chest pain, palpitations and leg swelling.  Gastrointestinal:  Negative for abdominal pain.  Genitourinary:  Negative for dysuria and vaginal bleeding.  Musculoskeletal:  Negative for back pain.        Slightly swollen right MCP joint, no redness, no  pain.  Neurological:  Negative for syncope, light-headedness and headaches.  Psychiatric/Behavioral:  Negative for dysphoric mood.    Objective:  BP (!) 142/112   Pulse 90   Temp 98.1 F (36.7 C) (Oral)   Ht 5' 4.92 (1.649 m)   Wt 201 lb 6 oz (91.3 kg)   SpO2 96%   BMI 33.59 kg/m   Wt Readings from Last 3 Encounters:  06/25/24 201 lb 6 oz (91.3 kg)  09/19/23 205 lb 9.6 oz (93.3 kg)  09/15/22 199 lb 2 oz (90.3 kg)      Physical Exam Constitutional:      General: She is not in acute distress.    Appearance: Normal appearance. She is well-developed. She is not ill-appearing or toxic-appearing.  HENT:     Head: Normocephalic.     Right Ear: Hearing, tympanic membrane, ear canal and  external ear normal. Tympanic membrane is not erythematous, retracted or bulging.     Left Ear: Hearing, tympanic membrane, ear canal and external ear normal. Tympanic membrane is not erythematous, retracted or bulging.     Nose: No mucosal edema or rhinorrhea.     Right Sinus: No maxillary sinus tenderness or frontal sinus tenderness.     Left Sinus: No maxillary sinus tenderness or frontal sinus tenderness.     Mouth/Throat:     Pharynx: Uvula midline.  Eyes:     General: Lids are normal. Lids are everted, no foreign bodies appreciated.     Conjunctiva/sclera: Conjunctivae normal.     Pupils: Pupils are equal, round, and reactive to light.  Neck:     Thyroid: No thyroid mass or thyromegaly.     Vascular: No carotid bruit.     Trachea: Trachea normal.  Cardiovascular:     Rate and Rhythm: Normal rate and regular rhythm.     Pulses: Normal pulses.     Heart sounds: Normal heart sounds, S1 normal and S2 normal. No murmur heard.    No friction rub. No gallop.  Pulmonary:     Effort: Pulmonary effort is normal. No tachypnea or respiratory distress.     Breath sounds: Normal breath sounds. No decreased breath sounds, wheezing, rhonchi or rales.  Abdominal:     General: Bowel sounds are normal.     Palpations: Abdomen is soft.     Tenderness: There is no abdominal tenderness.  Musculoskeletal:     Cervical back: Normal range of motion and neck supple.  Skin:    General: Skin is warm and dry.     Findings: No rash.  Neurological:     Mental Status: She is alert.  Psychiatric:        Mood and Affect: Mood is not anxious or depressed.        Speech: Speech normal.        Behavior: Behavior normal. Behavior is cooperative.        Thought Content: Thought content normal.        Judgment: Judgment normal.       Results for orders placed or performed in visit on 09/11/23  Lipid panel   Collection Time: 09/11/23  7:33 AM  Result Value Ref Range   Cholesterol 229 (H) 0 - 200 mg/dL    Triglycerides 841.9 (H) 0.0 - 149.0 mg/dL   HDL 39.89 >60.99 mg/dL   VLDL 68.3 0.0 - 59.9 mg/dL   LDL Cholesterol 862 (H) 0 - 99 mg/dL   Total CHOL/HDL Ratio 4    NonHDL 168.45  Comprehensive metabolic panel with GFR   Collection Time: 09/11/23  7:33 AM  Result Value Ref Range   Sodium 138 135 - 145 mEq/L   Potassium 4.2 3.5 - 5.1 mEq/L   Chloride 103 96 - 112 mEq/L   CO2 28 19 - 32 mEq/L   Glucose, Bld 90 70 - 99 mg/dL   BUN 13 6 - 23 mg/dL   Creatinine, Ser 9.01 0.40 - 1.20 mg/dL   Total Bilirubin 0.6 0.2 - 1.2 mg/dL   Alkaline Phosphatase 65 39 - 117 U/L   AST 20 0 - 37 U/L   ALT 20 0 - 35 U/L   Total Protein 7.1 6.0 - 8.3 g/dL   Albumin 4.5 3.5 - 5.2 g/dL   GFR 36.60 >39.99 mL/min   Calcium 9.4 8.4 - 10.5 mg/dL    Assessment and Plan  Essential hypertension Assessment & Plan:  Chronic, with acute worsening. Unclear cause of recent blood pressure worsening. Will evaluate with labs.  Given hair thinning some concern for new thyroid issue.  Minimal improvement with increase in losartan  to 100 mg daily and addition of 5 mg amlodipine .  Increase amlodipine  to 10 mg daily. If blood pressure unresponsive consider spironolactone  given hair issues. Return and ER precautions provided.  Orders: -     CBC with Differential/Platelet -     TSH -     Comprehensive metabolic panel with GFR  Hair thinning Assessment & Plan: Chronic, minimally responsive to minoxidil  topical. Consider spironolactone  for blood pressure control given possible benefit of hair stimulation.   Other orders -     amLODIPine  Besylate; Take 2 tablets (10 mg total) by mouth daily.    No follow-ups on file.   Greig Ring, MD  "

## 2024-06-25 NOTE — Assessment & Plan Note (Signed)
 Chronic, minimally responsive to minoxidil  topical. Consider spironolactone  for blood pressure control given possible benefit of hair stimulation.

## 2024-06-25 NOTE — Assessment & Plan Note (Addendum)
"   Chronic, with acute worsening. Unclear cause of recent blood pressure worsening. Will evaluate with labs.  Given hair thinning some concern for new thyroid issue.  Minimal improvement with increase in losartan  to 100 mg daily and addition of 5 mg amlodipine .  Increase amlodipine  to 10 mg daily. If blood pressure unresponsive consider spironolactone  given hair issues. Return and ER precautions provided. "

## 2024-06-26 ENCOUNTER — Other Ambulatory Visit: Payer: Self-pay | Admitting: Family Medicine

## 2024-06-26 ENCOUNTER — Ambulatory Visit: Payer: Self-pay | Admitting: Family Medicine

## 2024-06-26 DIAGNOSIS — I10 Essential (primary) hypertension: Secondary | ICD-10-CM

## 2024-06-26 MED ORDER — SPIRONOLACTONE 50 MG PO TABS: 50.0000 mg | ORAL_TABLET | Freq: Every day | ORAL | 11 refills | Status: DC

## 2024-06-26 MED ORDER — LOSARTAN POTASSIUM 50 MG PO TABS: 50.0000 mg | ORAL_TABLET | Freq: Every day | ORAL | Status: AC

## 2024-06-26 NOTE — Telephone Encounter (Signed)
 Note from pharmacy:    Pharmacy comment: THERE IS A DRUG INTERACTION BETWEEN SPIRONOLACTONE  AND LOSARTAN  (INCREASED RISK OF HYPERKALEMIA). PLEASE ADVISE. THANKS.
